# Patient Record
Sex: Female | Born: 1975
Health system: Southern US, Community
[De-identification: ages and names within clinical notes are randomized; demographics above are authoritative.]

## PROBLEM LIST (undated history)

## (undated) DIAGNOSIS — D6861 Antiphospholipid syndrome: Secondary | ICD-10-CM

## (undated) HISTORY — DX: Antiphospholipid syndrome: D68.61

## (undated) HISTORY — PX: KNEE ARTHROSCOPY: SUR90

## (undated) HISTORY — PX: TONSILLECTOMY AND ADENOIDECTOMY: SUR1326

## (undated) HISTORY — PX: LAPAROSCOPIC ABDOMINAL EXPLORATION: SHX6249

## (undated) HISTORY — PX: APPENDECTOMY: SHX54

---

## 1994-09-16 HISTORY — PX: HEMORRHOID SURGERY: SHX153

## 2006-09-16 HISTORY — PX: WRIST SURGERY: SHX841

## 2010-03-22 ENCOUNTER — Emergency Department (HOSPITAL_BASED_OUTPATIENT_CLINIC_OR_DEPARTMENT_OTHER): Admission: EM | Admit: 2010-03-22 | Discharge: 2010-03-22 | Payer: Self-pay | Admitting: Emergency Medicine

## 2010-04-18 ENCOUNTER — Emergency Department (HOSPITAL_BASED_OUTPATIENT_CLINIC_OR_DEPARTMENT_OTHER): Admission: EM | Admit: 2010-04-18 | Discharge: 2010-04-19 | Payer: Self-pay | Admitting: Emergency Medicine

## 2010-06-25 ENCOUNTER — Emergency Department (HOSPITAL_BASED_OUTPATIENT_CLINIC_OR_DEPARTMENT_OTHER): Admission: EM | Admit: 2010-06-25 | Discharge: 2010-06-25 | Payer: Self-pay | Admitting: Emergency Medicine

## 2010-11-29 LAB — BASIC METABOLIC PANEL
BUN: 19 mg/dL (ref 6–23)
CO2: 28 mEq/L (ref 19–32)
Calcium: 9.1 mg/dL (ref 8.4–10.5)
Creatinine, Ser: 0.8 mg/dL (ref 0.4–1.2)
Glucose, Bld: 96 mg/dL (ref 70–99)

## 2010-11-29 LAB — URINALYSIS, ROUTINE W REFLEX MICROSCOPIC
Bilirubin Urine: NEGATIVE
Hgb urine dipstick: NEGATIVE
Protein, ur: NEGATIVE mg/dL
Urobilinogen, UA: 1 mg/dL (ref 0.0–1.0)

## 2010-11-29 LAB — PREGNANCY, URINE: Preg Test, Ur: NEGATIVE

## 2010-11-30 LAB — BASIC METABOLIC PANEL
BUN: 18 mg/dL (ref 6–23)
Calcium: 9.3 mg/dL (ref 8.4–10.5)
Creatinine, Ser: 0.9 mg/dL (ref 0.4–1.2)
GFR calc non Af Amer: >60 mL/min (ref >60)
Glucose, Bld: 96 mg/dL (ref 70–99)

## 2010-11-30 LAB — DIFFERENTIAL
Basophils Absolute: 0.1 10*3/uL (ref 0.0–0.1)
Basophils Relative: 1 % (ref 0–1)
Monocytes Absolute: 0.7 10*3/uL (ref 0.1–1.0)
Neutro Abs: 5.9 10*3/uL (ref 1.7–7.7)
Neutrophils Relative %: 58 % (ref 43–77)

## 2010-11-30 LAB — CBC
HCT: 40.5 % (ref 36.0–46.0)
MCHC: 33.6 g/dL (ref 30.0–36.0)
Platelets: 179 10*3/uL (ref 150–400)
RDW: 12.4 % (ref 11.5–15.5)

## 2010-12-25 ENCOUNTER — Other Ambulatory Visit: Payer: Self-pay | Admitting: Obstetrics and Gynecology

## 2010-12-25 ENCOUNTER — Ambulatory Visit (HOSPITAL_COMMUNITY)
Admission: RE | Admit: 2010-12-25 | Discharge: 2010-12-25 | Disposition: A | Discharge status: Home / Self Care | Payer: Medicaid Other | Source: Ambulatory Visit | Attending: Obstetrics and Gynecology | Admitting: Obstetrics and Gynecology

## 2010-12-25 ENCOUNTER — Ambulatory Visit (HOSPITAL_COMMUNITY): Payer: Medicaid Other

## 2010-12-25 DIAGNOSIS — O3680X0 Pregnancy with inconclusive fetal viability, not applicable or unspecified: Secondary | ICD-10-CM

## 2010-12-25 DIAGNOSIS — O021 Missed abortion: Secondary | ICD-10-CM | POA: Insufficient documentation

## 2010-12-25 LAB — ABO/RH: ABO/RH(D): A POS

## 2010-12-25 LAB — CBC
MCH: 30.5 pg (ref 26.0–34.0)
Platelets: 215 10*3/uL (ref 150–400)
RBC: 4.49 MIL/uL (ref 3.87–5.11)

## 2010-12-26 LAB — HM PAP SMEAR: HM Pap smear: NEGATIVE

## 2011-01-02 NOTE — H&P (Signed)
  NAMEGENEVIA, Terri Foster             ACCOUNT NO.:  [id number]  MEDICAL RECORD NO.:  [id number]           PATIENT TYPE:  LOCATION:                                 FACILITY:  PHYSICIAN:  Sherron Monday, MD        DATE OF BIRTH:  11/10/1975  DATE OF ADMISSION:  12/25/2010 DATE OF DISCHARGE:                             HISTORY & PHYSICAL   ADMITTING DIAGNOSES: 1. Missed abortion measuring approximately 8-week size. 2. Positive anticardiolipin antibody.  HISTORY OF PRESENT ILLNESS:  A 35 year old G78, P 1-0-2-1 at 8 and 6 by dates who presents for an OB workup today.  She has had several ultrasounds that verified intrauterine pregnancy and even showed heart sounds, but today on presentation after her workup ultrasound revealed absent heart tones and fetal rump length that measuring approximately 8 weeks.  After discussion of various options including effective management, Cytotec induction versus suction, D and C, the patient decides to pursue with D and C.  After discussion of risks, benefits, and alteratives including but not limited to bleeding, infection, damage to her uterus, this was scheduled tomorrow at the hospital.  PAST MEDICAL HISTORY:  Significant for infertility and positive anticardiolipin antibody.  PAST SURGICAL HISTORY:  Significant for appendectomy, hemorrhoid surgery, knee surgery, laparoscopy and a tonsillectomy and adenoidectomy.  PAST OBSTETRIC AND GYNECOLOGIC HISTORY:  She is a G4, P 1-0-2-1 at approximately 8 and 6 by dates.  She has a history of a term delivery sometimes of a female infant in 2006 as well as 2 miscarriages.  No history of any abnormal Pap smear or sexually transmitted diseases.  MEDICATIONS:  Lovenox daily, aspirin, Caltrate, and prenatal vitamins.  ALLERGIES:  ERYTHROMYCIN which causes rash and vomiting.  No latex allergy.  SOCIAL HISTORY:  Denies alcohol, tobacco, or drug use.  She is married.  FAMILY HISTORY:  Diabetes, hypertension,  heart disease, Hilda Blades disease, lupus, ovarian cancer and unknown type of cancer.  PHYSICAL EXAMINATION:  VITAL SIGNS:  She is afebrile.  Vital signs stable. GENERAL:  No apparent distress. CARDIOVASCULAR:  Regular rate and rhythm. LUNGS:  Clear to auscultation bilaterally. ABDOMEN:  Soft, nontender and nondistended with good bowel sounds. EXTREMITIES:  Symmetric and nontender.  On an ultrasound performed today noted that there is no fetal sounds.  ASSESSMENT AND PLAN:  A 35 year old G4, P 1-0-2-1 at 8 weeks with missed abortion for suction, D and C.  Discussed with the patient risks, benefits, and alternatives and will proceed.     Sherron Monday, MD     JB/MEDQ  D:  12/24/2010  T:  12/24/2010  Job:  [id number]  Electronically Signed by Sherron Monday MD on 01/02/2011 04:32:23 PM

## 2011-01-02 NOTE — Op Note (Signed)
  Terri Foster, Terri Foster             ACCOUNT NO.:  [id number]  MEDICAL RECORD NO.:  [id number]           PATIENT TYPE:  O  LOCATION:  [facility]                          FACILITY:  [facility]  PHYSICIAN:  Sherron Monday, MD        DATE OF BIRTH:  [birth year]  DATE OF PROCEDURE:  12/25/2010 DATE OF DISCHARGE:                              OPERATIVE REPORT   PREOPERATIVE DIAGNOSIS:  Missed abortion approximately 8 weeks.  POSTOPERATIVE DIAGNOSIS:  Missed abortion approximately 8 weeks.  PROCEDURE:  Suction dilatation and evacuation.  SURGEON:  Sherron Monday, MD  ASSISTANT:  None.  ANESTHESIA:  MAC with 16 mL of 2% lidocaine for a paracervical block.  FINDINGS:  Uterus sounds to approximately 10-cm products of conception.  ASSISTANT:  Products of conception to Pathology.  ESTIMATED BLOOD LOSS:  Minimal.  IV FLUIDS:  1000 mL.  URINE OUTPUT:  I and O cath prior to the procedure.  COMPLICATIONS:  None.  DISPOSITION:  Stable to PACU following the procedure.  PROCEDURE:  After informed consent was reviewed with the patient including risks, benefits and alternatives of the surgical procedure, she was transported to the OR, placed on the table in supine position. MAC was induced and found to be adequate.  She was then prepped and draped in the normal sterile fashion, placed in the [place] stirrups. Her bladder was sterilely drained using an open-sided speculum.  Her cervix was easily visualized after approximately 4 mL were infiltrated into the anterior cervical lip.  It was grasped with a single-toothed tenaculum.  A paracervical block was then placed at approximately 5 and 7 o'clock for a total of 16 mL.  The cervix was then dilated to 24- [place] to accommodate an 8 curette.  After her uterus had been sounded, D and C was performed with several passes revealing products of conception.  The suction curette and tenaculum were removed from her vagina and found to be hemostatic. The speculum was  removed.  The patient was awakened in stable condition. Sponge, lap and needle counts were correct x2.     Sherron Monday, MD     JB/MEDQ  D:  12/25/2010  T:  01-06-11  Job:  [id number]  Electronically Signed by Sherron Monday MD on 01/02/2011 04:32:32 PM

## 2011-01-15 DEATH — deceased

## 2016-03-27 ENCOUNTER — Ambulatory Visit (INDEPENDENT_AMBULATORY_CARE_PROVIDER_SITE_OTHER): Payer: BLUE CROSS/BLUE SHIELD | Admitting: Family Medicine

## 2016-03-27 ENCOUNTER — Encounter: Payer: Self-pay | Admitting: Family Medicine

## 2016-03-27 VITALS — BP 111/75 | HR 69 | Wt 170.0 lb

## 2016-03-27 DIAGNOSIS — M6249 Contracture of muscle, multiple sites: Secondary | ICD-10-CM | POA: Diagnosis not present

## 2016-03-27 DIAGNOSIS — M62838 Other muscle spasm: Secondary | ICD-10-CM | POA: Insufficient documentation

## 2016-03-27 MED ORDER — CYCLOBENZAPRINE HCL 10 MG PO TABS: 5.0000 mg | ORAL_TABLET | Freq: Every evening | ORAL | Status: DC | PRN

## 2016-03-27 NOTE — Patient Instructions (Signed)
Thank you for coming in today. Come back or go to the emergency room if you notice new weakness new numbness problems walking or bowel or bladder problems.  Take cyclobenzaprine at bedtime as needed for muscle spasms. Use a heating pad and a TENS unit. Attend physical therapy  Return in 2-4 weeks if not better.  TENS UNIT: This is helpful for muscle pain and spasm.   Search and Purchase a TENS 7000 2nd edition at www.tenspros.com. It should be less than $30.     TENS unit instructions: Do not shower or bathe with the unit on Turn the unit off before removing electrodes or batteries If the electrodes lose stickiness add a drop of water to the electrodes after they are disconnected from the unit and place on plastic sheet. If you continued to have difficulty, call the TENS unit company to purchase more electrodes. Do not apply lotion on the skin area prior to use. Make sure the skin is clean and dry as this will help prolong the life of the electrodes. After use, always check skin for unusual red areas, rash or other skin difficulties. If there are any skin problems, does not apply electrodes to the same area. Never remove the electrodes from the unit by pulling the wires. Do not use the TENS unit or electrodes other than as directed. Do not change electrode placement without consultating your therapist or physician. Keep 2 fingers with between each electrode. Wear time ratio is 2:1, on to off times.    For example on for 30 minutes off for 15 minutes and then on for 30 minutes off for 15 minutes   Cervical Sprain A cervical sprain is an injury in the neck in which the strong, fibrous tissues (ligaments) that connect your neck bones stretch or tear. Cervical sprains can range from mild to severe. Severe cervical sprains can cause the neck vertebrae to be unstable. This can lead to damage of the spinal cord and can result in serious nervous system problems. The amount of time it takes  for a cervical sprain to get better depends on the cause and extent of the injury. Most cervical sprains heal in 1 to 3 weeks. CAUSES  Severe cervical sprains may be caused by:   Contact sport injuries (such as from football, rugby, wrestling, hockey, auto racing, gymnastics, diving, martial arts, or boxing).   Motor vehicle collisions.   Whiplash injuries. This is an injury from a sudden forward and backward whipping movement of the head and neck.  Falls.  Mild cervical sprains may be caused by:   Being in an awkward position, such as while cradling a telephone between your ear and shoulder.   Sitting in a chair that does not offer proper support.   Working at a poorly Marketing executive station.   Looking up or down for long periods of time.  SYMPTOMS   Pain, soreness, stiffness, or a burning sensation in the front, back, or sides of the neck. This discomfort may develop immediately after the injury or slowly, 24 hours or more after the injury.   Pain or tenderness directly in the middle of the back of the neck.   Shoulder or upper back pain.   Limited ability to move the neck.   Headache.   Dizziness.   Weakness, numbness, or tingling in the hands or arms.   Muscle spasms.   Difficulty swallowing or chewing.   Tenderness and swelling of the neck.  DIAGNOSIS  Most of the  time your health care provider can diagnose a cervical sprain by taking your history and doing a physical exam. Your health care provider will ask about previous neck injuries and any known neck problems, such as arthritis in the neck. X-rays may be taken to find out if there are any other problems, such as with the bones of the neck. Other tests, such as a CT scan or MRI, may also be needed.  TREATMENT  Treatment depends on the severity of the cervical sprain. Mild sprains can be treated with rest, keeping the neck in place (immobilization), and pain medicines. Severe cervical sprains  are immediately immobilized. Further treatment is done to help with pain, muscle spasms, and other symptoms and may include:  Medicines, such as pain relievers, numbing medicines, or muscle relaxants.   Physical therapy. This may involve stretching exercises, strengthening exercises, and posture training. Exercises and improved posture can help stabilize the neck, strengthen muscles, and help stop symptoms from returning.  HOME CARE INSTRUCTIONS   Put ice on the injured area.   Put ice in a plastic bag.   Place a towel between your skin and the bag.   Leave the ice on for 15-20 minutes, 3-4 times a day.   If your injury was severe, you may have been given a cervical collar to wear. A cervical collar is a two-piece collar designed to keep your neck from moving while it heals.  Do not remove the collar unless instructed by your health care provider.  If you have long hair, keep it outside of the collar.  Ask your health care provider before making any adjustments to your collar. Minor adjustments may be required over time to improve comfort and reduce pressure on your chin or on the back of your head.  Ifyou are allowed to remove the collar for cleaning or bathing, follow your health care provider's instructions on how to do so safely.  Keep your collar clean by wiping it with mild soap and water and drying it completely. If the collar you have been given includes removable pads, remove them every 1-2 days and hand wash them with soap and water. Allow them to air dry. They should be completely dry before you wear them in the collar.  If you are allowed to remove the collar for cleaning and bathing, wash and dry the skin of your neck. Check your skin for irritation or sores. If you see any, tell your health care provider.  Do not drive while wearing the collar.   Only take over-the-counter or prescription medicines for pain, discomfort, or fever as directed by your health care  provider.   Keep all follow-up appointments as directed by your health care provider.   Keep all physical therapy appointments as directed by your health care provider.   Make any needed adjustments to your workstation to promote good posture.   Avoid positions and activities that make your symptoms worse.   Warm up and stretch before being active to help prevent problems.  SEEK MEDICAL CARE IF:   Your pain is not controlled with medicine.   You are unable to decrease your pain medicine over time as planned.   Your activity level is not improving as expected.  SEEK IMMEDIATE MEDICAL CARE IF:   You develop any bleeding.  You develop stomach upset.  You have signs of an allergic reaction to your medicine.   Your symptoms get worse.   You develop new, unexplained symptoms.   You have  numbness, tingling, weakness, or paralysis in any part of your body.  MAKE SURE YOU:   Understand these instructions.  Will watch your condition.  Will get help right away if you are not doing well or get worse.   This information is not intended to replace advice given to you by your health care provider. Make sure you discuss any questions you have with your health care provider.   Document Released: 06/30/2007 Document Revised: 09/07/2013 Document Reviewed: 03/10/2013 Elsevier Interactive Patient Education [employer]

## 2016-03-27 NOTE — Progress Notes (Signed)
   Terri Foster is a 40 y.o. female who [facility] today for neck pain. Patient has a ten-day history of acute onset right lateral neck pain. Patient usually sleeps with a special cervical pillow. However she was on vacation about 10 days ago and forgot her special pillow. She awoke one morning with a "kink" in her neck. This was mild and worsened dramatically when she exercises by running on a treadmill. She felt sudden onset of cramping and spasm in her right lateral neck area and since then the pain has been moderate to severe and limiting her ability to move her neck normally and to exercise normally. She denies any radiating pain weakness or numbness fevers or chills. She's tried some methocarbamol NSAIDs heat and ice which have all not help very much at all.   No past medical history on file. No past surgical history on file. Social History  Substance Use Topics  . Smoking status: Never Smoker   . Smokeless tobacco: Not on file  . Alcohol Use: No   family history is not on file.  ROS:  No headache, visual changes, nausea, vomiting, diarrhea, constipation, dizziness, abdominal pain, skin rash, fevers, chills, night sweats, weight loss, swollen lymph nodes, body aches, joint swelling, muscle aches, chest pain, shortness of breath, mood changes, visual or auditory hallucinations.    Medications: Current Outpatient Prescriptions  Medication Sig Dispense Refill  . Multiple Vitamin (MULTIVITAMIN) capsule Take 1 capsule by mouth daily.    . cyclobenzaprine (FLEXERIL) 10 MG tablet Take 0.5-1 tablets (5-10 mg total) by mouth at bedtime as needed for muscle spasms. 30 tablet 1   No current facility-administered medications for this visit.   Allergies  Allergen Reactions  . Azithromycin Rash     Exam:  BP 111/75 mmHg  Pulse 69  Wt 170 lb (77.111 kg) General: Well Developed, well nourished, and in no acute distress.    Neuro/Psych: Alert and oriented x3, extra-ocular muscles intact, able to move all 4 extremities, sensation grossly intact. Skin: Warm and dry, no rashes noted.  Respiratory: Not using accessory muscles, speaking in full sentences, trachea midline.  Cardiovascular: Pulses palpable, no extremity edema. Abdomen: Does not appear distended. MSK: Neck: Nontender to spinal midline. Tender palpation superior right cervical paraspinal muscle group. Nontender trapezius. Significantly limited neck motion by pain Upper extremity strength is equal and normal throughout as are reflexes and sensation. No tremor noted.   No results found for this or any previous visit (from the past 24 hour(s)). No results found.   Assessment and Plan: 40 y.o. female with cervical paraspinal muscle spasm and strain. Plan for cyclobenzaprine heating pad TENS unit NSAIDs and referral to physical therapy. Return in 2-4 weeks if not better or sooner if worsening.

## 2016-04-02 ENCOUNTER — Telehealth: Payer: Self-pay | Admitting: Family Medicine

## 2016-04-02 ENCOUNTER — Ambulatory Visit: Payer: BLUE CROSS/BLUE SHIELD | Admitting: Rehabilitative and Restorative Service Providers"

## 2016-04-02 MED ORDER — TRAMADOL HCL 50 MG PO TABS: 50.0000 mg | ORAL_TABLET | Freq: Three times a day (TID) | ORAL | Status: DC | PRN

## 2016-04-02 NOTE — Telephone Encounter (Signed)
Will use tramadol.

## 2016-04-02 NOTE — Telephone Encounter (Signed)
Pt advised of Rx, it has been faxed to preferred pharmacy.

## 2016-04-02 NOTE — Telephone Encounter (Signed)
Pt called clinic stating she cannot get an appt until next Thursday with PT and is having a lot of neck pain. Pt states the recommendations from last OV are not helping. Questions if something else can be tried. Will route.

## 2016-04-09 ENCOUNTER — Ambulatory Visit: Payer: BLUE CROSS/BLUE SHIELD | Admitting: Rehabilitative and Restorative Service Providers"

## 2016-04-10 ENCOUNTER — Ambulatory Visit: Payer: BLUE CROSS/BLUE SHIELD | Admitting: Rehabilitative and Restorative Service Providers"

## 2016-04-26 ENCOUNTER — Ambulatory Visit (INDEPENDENT_AMBULATORY_CARE_PROVIDER_SITE_OTHER): Payer: BLUE CROSS/BLUE SHIELD | Admitting: Family Medicine

## 2016-04-26 ENCOUNTER — Encounter: Payer: Self-pay | Admitting: Family Medicine

## 2016-04-26 VITALS — BP 112/55 | HR 56 | Resp 15 | Ht 66.0 in | Wt 170.0 lb

## 2016-04-26 DIAGNOSIS — D6861 Antiphospholipid syndrome: Secondary | ICD-10-CM | POA: Insufficient documentation

## 2016-04-26 DIAGNOSIS — S161XXA Strain of muscle, fascia and tendon at neck level, initial encounter: Secondary | ICD-10-CM

## 2016-04-26 DIAGNOSIS — R635 Abnormal weight gain: Secondary | ICD-10-CM

## 2016-04-26 DIAGNOSIS — Z13220 Encounter for screening for lipoid disorders: Secondary | ICD-10-CM

## 2016-04-26 DIAGNOSIS — M2170 Unequal limb length (acquired), unspecified site: Secondary | ICD-10-CM | POA: Insufficient documentation

## 2016-04-26 HISTORY — DX: Antiphospholipid syndrome: D68.61

## 2016-04-26 NOTE — Progress Notes (Signed)
Subjective:    Patient ID: Terri Foster, female    DOB: 06-Mar-1976, 40 y.o.   MRN: [id number]  HPI  This is a 40 year old female with no significant past medical history comes in today to discuss establish care. She her husband recently moved to the area. Her mother lives in the area and is my patient as well. Her mother is Shauna Hugh. She is interested in reestablishing care with an OB/GYN as well.  She has been struggling with some cervical and upper back pain. She has been working with a [employer] and has been getting better. She normally runs and goes to the gym for exercise but has backed off and has just been walking more recently. She also found out she had a leg length discrepancy and has been wearing a heel lift which she feels like has been helpful as well. Next pressure reports she had a tetanus done in 2012.  She thinks she has never had cholesterol screening.  Is also struggling with her weight. She said she gained a lot of weight during her pregnancy was able to get it off but slowly gained some of it back. She's gotten down to her current weight but would really like to lose about 10 more pounds. She is really just plateaued and can't quite figure out how to get the scale to move and wanted some recommendations.   Review of Systems  Constitutional: Negative for diaphoresis, fever and unexpected weight change.  HENT: Negative for hearing loss, rhinorrhea and tinnitus.   Eyes: Negative for visual disturbance.  Respiratory: Negative for cough and wheezing.   Cardiovascular: Negative for chest pain and palpitations.  Gastrointestinal: Negative for blood in stool, diarrhea, nausea and vomiting.  Genitourinary: Negative for difficulty urinating, vaginal bleeding and vaginal discharge.  Musculoskeletal: Negative for arthralgias and myalgias.  Skin: Negative for rash.  Neurological: Negative for headaches.  Hematological: Negative for adenopathy. Does not bruise/bleed easily.   Psychiatric/Behavioral: Negative for dysphoric mood and sleep disturbance. The patient is not nervous/anxious.    BP (!) 112/55 (BP Location: Left Arm, Patient Position: Sitting, Cuff Size: Normal)   Pulse (!) 56   Resp 15   Ht  (1.676 m)   Wt 170 lb (77.1 kg)   LMP 04/26/2016 (Exact Date)   SpO2 100%   BMI 27.44 kg/m     Allergies  Allergen Reactions  . Erythromycin   . Azithromycin Rash    History reviewed. No pertinent past medical history.  Past Surgical History:  Procedure Laterality Date  . APPENDECTOMY    . HEMORRHOID SURGERY  1996  . KNEE ARTHROSCOPY Right   . LAPAROSCOPIC ABDOMINAL EXPLORATION     Fertility  . TONSILLECTOMY AND ADENOIDECTOMY  age 89  . WRIST SURGERY Right 2008    Social History   Social History  . Marital status: Married    Spouse name: Marcello Moores  . Number of children: 1  . Years of education: N/A   Occupational History  . Not on file.   Social History Main Topics  . Smoking status: Never Smoker  . Smokeless tobacco: Never Used  . Alcohol use No  . Drug use: No  . Sexual activity: Yes    Partners: Male    Birth control/ protection: None   Other Topics Concern  . Not on file   Social History Narrative   Married to [place] with one child. She occasionally drinks coffee. Walks 30-40 minutes daily.    Family History  Problem Relation Age of Onset  . Hypertension Father     Outpatient Encounter Prescriptions as of 04/26/2016  Medication Sig  . cyclobenzaprine (FLEXERIL) 10 MG tablet Take 0.5-1 tablets (5-10 mg total) by mouth at bedtime as needed for muscle spasms.  . Multiple Vitamin (MULTIVITAMIN) capsule Take 1 capsule by mouth daily.  . traMADol (ULTRAM) 50 MG tablet Take 1 tablet (50 mg total) by mouth every 8 (eight) hours as needed.   No facility-administered encounter medications on file as of 04/26/2016.          Objective:   Physical Exam  Constitutional: She is oriented to person, place, and time. She appears  well-developed and well-nourished.  HENT:  Head: Normocephalic and atraumatic.  Cardiovascular: Normal rate, regular rhythm and normal heart sounds.   Pulmonary/Chest: Effort normal and breath sounds normal.  Neurological: She is alert and oriented to person, place, and time.  Skin: Skin is warm and dry.  Psychiatric: She has a normal mood and affect. Her behavior is normal.        Assessment & Plan:    She reports her tetanus is up-to-date. She is due for screening lipid panel. She's not sure that she is actually ever had one. Will check a CMP as well.  Abnormal weight gain-she's really had a lot of difficulty with the last 10 pounds that she's been trying to lose. Recommend a smart phone application call my fitness pal to help set goals. Also encourage regular exercise.    Cervical Strain - seeing chiropractor.  Call if sxs worsen or if not improving.

## 2016-04-26 NOTE — Progress Notes (Signed)
Subjective:    CC: New Patient, estab care  HPI:  Past medical history, Surgical history, Family history not pertinant except as noted below, Social history, Allergies, and medications have been entered into the medical record, reviewed, and corrections made.   Review of Systems: No fevers, chills, night sweats, weight loss, chest pain, or shortness of breath.   Objective:    General: Well Developed, well nourished, and in no acute distress.  Neuro: Alert and oriented x3, extra-ocular muscles intact, sensation grossly intact.  HEENT: Normocephalic, atraumatic  Skin: Warm and dry, no rashes. Cardiac: Regular rate and rhythm, no murmurs rubs or gallops, no lower extremity edema.  Respiratory: Clear to auscultation bilaterally. Not using accessory muscles, speaking in full sentences.   Impression and Recommendations:

## 2016-04-26 NOTE — Patient Instructions (Signed)
My [employer]itness Pal is a great free smart phone app to calculate your calories.

## 2016-07-06 LAB — HM MAMMOGRAPHY

## 2016-07-11 ENCOUNTER — Encounter: Payer: Self-pay | Admitting: Family Medicine

## 2016-08-28 ENCOUNTER — Encounter: Payer: Self-pay | Admitting: Physician Assistant

## 2016-08-28 ENCOUNTER — Ambulatory Visit (INDEPENDENT_AMBULATORY_CARE_PROVIDER_SITE_OTHER): Payer: BLUE CROSS/BLUE SHIELD | Admitting: Physician Assistant

## 2016-08-28 VITALS — BP 123/76 | HR 77 | Ht 66.0 in | Wt 164.0 lb

## 2016-08-28 DIAGNOSIS — R110 Nausea: Secondary | ICD-10-CM

## 2016-08-28 DIAGNOSIS — R319 Hematuria, unspecified: Secondary | ICD-10-CM

## 2016-08-28 DIAGNOSIS — R109 Unspecified abdominal pain: Secondary | ICD-10-CM | POA: Diagnosis not present

## 2016-08-28 LAB — POCT URINE PREGNANCY: PREG TEST UR: NEGATIVE

## 2016-08-28 LAB — POCT URINALYSIS DIPSTICK
Glucose, UA: NEGATIVE
NITRITE UA: NEGATIVE
PH UA: 5.5
Protein, UA: NEGATIVE
Spec Grav, UA: >=1.03
UROBILINOGEN UA: 0.2

## 2016-08-28 NOTE — Progress Notes (Signed)
   Subjective:    Patient ID: Terri Foster, female    DOB: 03/19/1976, 40 y.o.   MRN: [id number]  HPI  Pt is a 40 yo female who presents to the clinic with 3 weeks of GI complaints. She went to [place] and has had symptoms since. Started with nausea off and on. She then started having upper and lower abdomen cramping. Last week she had a few headaches which were abnormal. Eating is making her feel nauseated. No stool changes. No ST, fever, chills, body aches. No new diet changes. LMP before thanksgiving but period did seem a little shorter. She has never been able to get pregnant.    Review of Systems  All other systems reviewed and are negative.      Objective:   Physical Exam  Constitutional: She is oriented to person, place, and time. She appears well-developed and well-nourished.  HENT:  Head: Normocephalic and atraumatic.  Cardiovascular: Normal rate, regular rhythm and normal heart sounds.   Pulmonary/Chest: Effort normal and breath sounds normal.  Abdominal: Soft. Bowel sounds are normal. She exhibits no distension and no mass. There is tenderness. There is no rebound and no guarding.  Mild tenderness generally over epigastric area.  Negative murphys sign.  No guarding or rebound.   Neurological: She is alert and oriented to person, place, and time.  Psychiatric: She has a normal mood and affect. Her behavior is normal.          Assessment & Plan:  Marland Kitchen.Marland Kitchen.Terri Foster was seen today for abdominal pain, nausea and headache.  Diagnoses and all orders for this visit:  Nausea without vomiting -     POCT urine pregnancy -     US Abdomen Complete; Future -     COMPLETE METABOLIC PANEL WITH GFR -     CBC with Differential/Platelet -     Lipase -     POCT urinalysis dipstick -     Urine Culture -     omeprazole (PRILOSEC) 40 MG capsule; Take 1 capsule (40 mg total) by mouth daily.  Abdominal pain, unspecified abdominal location -     POCT urine pregnancy -      US Abdomen Complete; Future -     COMPLETE METABOLIC PANEL WITH GFR -     CBC with Differential/Platelet -     Lipase -     POCT urinalysis dipstick -     Urine Culture -     omeprazole (PRILOSEC) 40 MG capsule; Take 1 capsule (40 mg total) by mouth daily.  Hematuria, unspecified type -     Urine Culture   UPT was negative.  Will culture UA due to blood and leuks. Since asymptomatic will not treat with abx until culture comes back.  Ultrasound of abdomen ordered.  Declined anti-nausea meds.  If everything normal try PPI for 10 days and see if symptoms improve.  Consider probiotic.

## 2016-08-29 LAB — COMPLETE METABOLIC PANEL WITH GFR
ALBUMIN: 4.1 g/dL (ref 3.6–5.1)
ALK PHOS: 70 U/L (ref 33–115)
ALT: 15 U/L (ref 6–29)
AST: 18 U/L (ref 10–30)
BUN: 15 mg/dL (ref 7–25)
CO2: 25 mmol/L (ref 20–31)
Calcium: 9.3 mg/dL (ref 8.6–10.2)
Chloride: 103 mmol/L (ref 98–110)
Creat: 0.92 mg/dL (ref 0.50–1.10)
GFR, EST NON AFRICAN AMERICAN: 78 mL/min (ref >=60)
GFR, Est African American: 89 mL/min (ref >=60)
GLUCOSE: 88 mg/dL (ref 65–99)
POTASSIUM: 4.3 mmol/L (ref 3.5–5.3)
SODIUM: 139 mmol/L (ref 135–146)
Total Bilirubin: 0.7 mg/dL (ref 0.2–1.2)
Total Protein: 6.7 g/dL (ref 6.1–8.1)

## 2016-08-29 LAB — CBC WITH DIFFERENTIAL/PLATELET
BASOS ABS: 0 {cells}/uL (ref 0–200)
BASOS PCT: 0 %
EOS PCT: 1 %
Eosinophils Absolute: 83 cells/uL (ref 15–500)
HCT: 45.3 % — ABNORMAL HIGH (ref 35.0–45.0)
HEMOGLOBIN: 14.9 g/dL (ref 11.7–15.5)
LYMPHS ABS: 3154 {cells}/uL (ref 850–3900)
Lymphocytes Relative: 38 %
MCH: 29.4 pg (ref 27.0–33.0)
MCHC: 32.9 g/dL (ref 32.0–36.0)
MCV: 89.5 fL (ref 80.0–100.0)
MPV: 11.8 fL (ref 7.5–12.5)
Monocytes Absolute: 664 cells/uL (ref 200–950)
Monocytes Relative: 8 %
NEUTROS ABS: 4399 {cells}/uL (ref 1500–7800)
Neutrophils Relative %: 53 %
Platelets: 208 10*3/uL (ref 140–400)
RBC: 5.06 MIL/uL (ref 3.80–5.10)
RDW: 13.7 % (ref 11.0–15.0)
WBC: 8.3 10*3/uL (ref 3.8–10.8)

## 2016-08-29 LAB — LIPASE: LIPASE: 23 U/L (ref 7–60)

## 2016-08-29 NOTE — Progress Notes (Signed)
I am waiting for the culture to return. That will determine treatment since she is asymptomatic.

## 2016-08-30 DIAGNOSIS — R109 Unspecified abdominal pain: Secondary | ICD-10-CM | POA: Insufficient documentation

## 2016-08-30 DIAGNOSIS — R319 Hematuria, unspecified: Secondary | ICD-10-CM | POA: Insufficient documentation

## 2016-08-30 LAB — URINE CULTURE: Organism ID, Bacteria: NO GROWTH

## 2016-08-30 MED ORDER — OMEPRAZOLE 40 MG PO CPDR: 40.0000 mg | DELAYED_RELEASE_CAPSULE | Freq: Every day | ORAL | 2 refills | Status: DC

## 2016-09-04 ENCOUNTER — Ambulatory Visit (INDEPENDENT_AMBULATORY_CARE_PROVIDER_SITE_OTHER): Payer: BLUE CROSS/BLUE SHIELD

## 2016-09-04 DIAGNOSIS — R110 Nausea: Secondary | ICD-10-CM

## 2016-09-04 DIAGNOSIS — R109 Unspecified abdominal pain: Secondary | ICD-10-CM

## 2016-09-04 DIAGNOSIS — R1011 Right upper quadrant pain: Secondary | ICD-10-CM | POA: Diagnosis not present

## 2016-09-04 NOTE — Progress Notes (Signed)
Call pt: gallbladder showed no stones or wall thickening. If continues to have pain then consider HIDA scan to evaluate functionally  of gallbladder is symptoms persist.

## 2016-09-05 NOTE — Progress Notes (Signed)
Ok to send zolfran 8mg  1 tablet every 8 hours as needed for nausea. #30 1 refill

## 2016-09-06 ENCOUNTER — Other Ambulatory Visit: Payer: Self-pay

## 2016-09-06 MED ORDER — ONDANSETRON 8 MG PO TBDP: 8.0000 mg | ORAL_TABLET | Freq: Three times a day (TID) | ORAL | 1 refills | Status: DC | PRN

## 2016-10-29 ENCOUNTER — Encounter: Payer: Self-pay | Admitting: Physician Assistant

## 2016-10-29 ENCOUNTER — Ambulatory Visit (INDEPENDENT_AMBULATORY_CARE_PROVIDER_SITE_OTHER): Payer: BLUE CROSS/BLUE SHIELD | Admitting: Physician Assistant

## 2016-10-29 VITALS — BP 113/72 | HR 73 | Ht 66.0 in | Wt 171.0 lb

## 2016-10-29 DIAGNOSIS — K921 Melena: Secondary | ICD-10-CM | POA: Diagnosis not present

## 2016-10-29 DIAGNOSIS — R1013 Epigastric pain: Secondary | ICD-10-CM | POA: Diagnosis not present

## 2016-10-29 LAB — POCT HEMOGLOBIN: Hemoglobin: 12.4 g/dL (ref 12.2–16.2)

## 2016-10-29 NOTE — Progress Notes (Addendum)
Subjective:     Patient ID: Terri Foster, female   DOB: 06/20/1976, 41 y.o.   MRN: [id number]  HPI  This is a 41 year old female who presents for persistent abdominal pain since November 2017. States it is located in the epigastric region and occasionally radiates to the RUQ. Pain is constant and is further aggravated by eating and running. Also complains of associated nausea. States she has experienced increase flatulence and belching. Relays she has not been taking Prilosec because it did not help after 10 days. Abdominal U/S was unremarkable. CBC, CMP, lipase all normal as well in December 2017. States stools are darker than normal and have a "sticky" appearance for the past month.  Denies NSAID and alcohol use.  Active Ambulatory Problems    Diagnosis Date Noted  . Cervical paraspinal muscle spasm 03/27/2016  . Antiphospholipid antibody syndrome (HCC) 04/26/2016  . Leg length discrepancy 04/26/2016  . Abdominal pain 08/30/2016  . Hematuria 08/30/2016   Resolved Ambulatory Problems    Diagnosis Date Noted  . No Resolved Ambulatory Problems   No Additional Past Medical History    Review of Systems  Constitutional: Negative for appetite change, fatigue, fever and unexpected weight change.  HENT: Negative.   Respiratory: Negative.   Cardiovascular: Negative.   Gastrointestinal: Positive for abdominal pain and nausea. Negative for anal bleeding, constipation, diarrhea and vomiting.       Positive for melena.  Genitourinary: Negative for dysuria, flank pain, hematuria and pelvic pain.  Musculoskeletal: Negative for arthralgias and myalgias.       Objective:   Physical Exam  Constitutional: She is oriented to person, place, and time. She appears well-developed and well-nourished.  HENT:  Head: Normocephalic and atraumatic.  Cardiovascular: Normal rate and regular rhythm.  Exam reveals no gallop and no friction rub.   No murmur heard. Pulmonary/Chest: Effort normal and breath  sounds normal. She has no wheezes. She has no rales. She exhibits no tenderness.  Abdominal: Soft. Bowel sounds are normal. She exhibits no distension and no mass. There is tenderness in the epigastric area and left upper quadrant. There is no rebound, no guarding, no CVA tenderness, no tenderness at McBurney's point and negative Murphy's sign.    Musculoskeletal: Normal range of motion.  Neurological: She is alert and oriented to person, place, and time.  Skin: Skin is warm and dry. No rash noted.  Psychiatric: She has a normal mood and affect. Her behavior is normal.   POCT hemoglobin 12.4 today    Assessment/Plan:  [name] was seen today for abdominal pain.  Diagnoses and all orders for this visit:  Epigastric pain -     H. pylori breath test -     Ambulatory referral to Gastroenterology  Melena -     POCT hemoglobin -     H. pylori breath test   Hemoglobin 14.9 on 08/28/16 and 12.4 today which is suspicious for GI bleed. Ambulatory referral to GI given chronicity of pain as well as possible need for endoscopy pending urea breath test results. Restart PPI. Concern for hiatal hernia vs esophageal stricture vs PUD vs chronic cholecystitis.

## 2016-10-30 LAB — H. PYLORI BREATH TEST: H. pylori Breath Test: NOT DETECTED

## 2016-10-30 NOTE — Progress Notes (Signed)
Call pt: h.pylori is negative. Follow up with GI. Please let us know if you don't have appt.

## 2016-11-05 ENCOUNTER — Encounter: Payer: Self-pay | Admitting: Physician Assistant

## 2016-11-05 ENCOUNTER — Encounter: Payer: Self-pay | Admitting: Gastroenterology

## 2016-11-05 ENCOUNTER — Ambulatory Visit (INDEPENDENT_AMBULATORY_CARE_PROVIDER_SITE_OTHER): Payer: BLUE CROSS/BLUE SHIELD | Admitting: Physician Assistant

## 2016-11-05 VITALS — BP 98/66 | HR 78 | Ht 65.5 in | Wt 171.0 lb

## 2016-11-05 DIAGNOSIS — R110 Nausea: Secondary | ICD-10-CM | POA: Diagnosis not present

## 2016-11-05 DIAGNOSIS — R194 Change in bowel habit: Secondary | ICD-10-CM

## 2016-11-05 DIAGNOSIS — R1013 Epigastric pain: Secondary | ICD-10-CM | POA: Diagnosis not present

## 2016-11-05 DIAGNOSIS — R1314 Dysphagia, pharyngoesophageal phase: Secondary | ICD-10-CM | POA: Diagnosis not present

## 2016-11-05 NOTE — Progress Notes (Signed)
Chief Complaint: Epigastric Pain  HPI:  [name] is a 41 year old Caucasian female with no significant past medical history,  who was referred to me by Jomarie LongsBreeback, Jade L, PA-C for a complaint of epigastric pain.      According to chart review patient has been followed by her PCP for this pain since back in December. When she initially presented this pain had started in November and was epigastric and spread some to her right upper quadrant. This was constant and further aggravated by eating and running. Patient also described accompanying nausea as well as an increase in flatulence and belching. She had tried Prilosec 40 mg for 10 days but this did not help so she stopped this medication. Abdominal ultrasound completed 09/04/16 showed no gallstones or sonographic evidence of acute cholecystitis as well as a normal appearance of the liver and spleen and limited visualization of the pancreas. Patient has also had labs to include a CBC, CMP and lipase which were all normal. Patient has also had an H. pylori breath test which was negative. Patient had described her stools being "darker than normal" over the past month and "sticky". Of note patient's labs did show a hemoglobin which has dropped from 14.9 2 months ago to 12.4 7 days ago.   Today, the patient tells me that this epigastric pain, came on out of the blue in November. It actually started with severe nausea which made her unable to eat and then she developed an epigastric pain which somewhat radiated over to her right side. Since that time and this is been a constant 6/10 and sometimes increases. The patient has been unable to pinpoint exactly what makes this worse but sometimes it is worse when she is running and sometimes worse when she eats. Patient tells me that if she "touches this area it hurts there". She also describes an increase in gas and belching over this time. Patient also describes some symptoms of dysphagia noting that chicken  feels like it got stuck in her throat the other day and caused a severe "shooting pain", this passed when she felt like it "passed into my stomach". Patient has also felt "colder than normal". She did try Omeprazole 40 mg daily for a little over 10 days but did not feel like this made a difference so she stopped this medication.   Patient also tells me that her bowel movements have changed and are more "sticky looking", they also have a "stronger smell", and looked darker than normal. She also does not have a bowel movement every day.    Patient denies fever, chills, bright red blood in her stool, change in diet previous to this, weight loss, fatigue, anorexia, vomiting, heartburn, reflux, symptoms that awaken her at night, changes in medication recent NSAIDs.    Past Medical History: None  Past Surgical History:  Procedure Laterality Date  . APPENDECTOMY    . HEMORRHOID SURGERY  1996  . KNEE ARTHROSCOPY Right   . LAPAROSCOPIC ABDOMINAL EXPLORATION     Fertility  . TONSILLECTOMY AND ADENOIDECTOMY  age 89  . WRIST SURGERY Right 2008    Current Outpatient Prescriptions  Medication Sig Dispense Refill  . ondansetron (ZOFRAN ODT) 8 MG disintegrating tablet Take 1 tablet (8 mg total) by mouth every 8 (eight) hours as needed for nausea or vomiting. 30 tablet 1  . omeprazole (PRILOSEC) 40 MG capsule Take 1 capsule (40 mg total) by mouth daily. (Patient not taking: Reported on 11/05/2016) 30 capsule 2  No current facility-administered medications for this visit.     Allergies as of 11/05/2016 - Review Complete 11/05/2016  Allergen Reaction Noted  . Erythromycin Hives 04/26/2016  . Azithromycin Rash 03/27/2016    Family History  Problem Relation Age of Onset  . Hypertension Father     Social History   Social History  . Marital status: Married    Spouse name: Marcello Moores  . Number of children: 1  . Years of education: N/A   Occupational History  . Not on file.   Social History Main  Topics  . Smoking status: Never Smoker  . Smokeless tobacco: Never Used  . Alcohol use No  . Drug use: No  . Sexual activity: Yes    Partners: Male    Birth control/ protection: None   Other Topics Concern  . Not on file   Social History Narrative   Married to [place] with one child. She occasionally drinks coffee. Walks 30-40 minutes daily.    Review of Systems:    Constitutional: No weight loss, fever or chills Skin: No rash Cardiovascular: No chest pain  Respiratory: No SOB Gastrointestinal: See HPI and otherwise negative Genitourinary: No dysuria or change in urinary frequency Neurological: No headache, dizziness or syncope Musculoskeletal: No new muscle or joint pain Hematologic: No bruising Psychiatric: No history of depression or anxiety   Physical Exam:  Vital signs: Ht 5' 5.5" (1.664 m)   Wt 171 lb (77.6 kg)   LMP 10/25/2016 (Exact Date)   BMI 28.02 kg/m   Constitutional:   Pleasant Caucasian female appears to be in NAD, Well developed, Well nourished, alert and cooperative Head:  Normocephalic and atraumatic. Eyes:   PEERL, EOMI. No icterus. Conjunctiva pink. Ears:  Normal auditory acuity. Neck:  Supple Throat: Oral cavity and pharynx without inflammation, swelling or lesion.  Respiratory: Respirations even and unlabored. Lungs clear to auscultation bilaterally.   No wheezes, crackles, or rhonchi.  Cardiovascular: Normal S1, S2. No MRG. Regular rate and rhythm. No peripheral edema, cyanosis or pallor.  Gastrointestinal:  Soft, nondistended,moderate epigastric tenderness No rebound or guarding. Normal bowel sounds. No appreciable masses or hepatomegaly. Rectal:  Not performed.  Msk:  Symmetrical without gross deformities. Without edema, no deformity or joint abnormality.  Neurologic:  Alert and  oriented x4;  grossly normal neurologically.  Skin:   Dry and intact without significant lesions or rashes. Psychiatric: Demonstrates good judgement and reason without  abnormal affect or behaviors.  RECENT LABS AND IMAGING: CBC    Component Value Date/Time   WBC 8.3 08/28/2016 1410   RBC 5.06 08/28/2016 1410   HGB 12.4 10/29/2016 1217   HGB 14.9 08/28/2016 1410   HCT 45.3 (H) 08/28/2016 1410   PLT 208 08/28/2016 1410   MCV 89.5 08/28/2016 1410   MCH 29.4 08/28/2016 1410   MCHC 32.9 08/28/2016 1410   RDW 13.7 08/28/2016 1410   LYMPHSABS 3,154 08/28/2016 1410   MONOABS 664 08/28/2016 1410   EOSABS 83 08/28/2016 1410   BASOSABS 0 08/28/2016 1410    CMP     Component Value Date/Time   NA 139 08/28/2016 1410   K 4.3 08/28/2016 1410   CL 103 08/28/2016 1410   CO2 25 08/28/2016 1410   GLUCOSE 88 08/28/2016 1410   BUN 15 08/28/2016 1410   CREATININE 0.92 08/28/2016 1410   CALCIUM 9.3 08/28/2016 1410   PROT 6.7 08/28/2016 1410   ALBUMIN 4.1 08/28/2016 1410   AST 18 08/28/2016 1410   ALT 15 08/28/2016  1410   ALKPHOS 70 08/28/2016 1410   BILITOT 0.7 08/28/2016 1410   GFRNONAA 78 08/28/2016 1410   GFRAA 89 08/28/2016 1410   ABDOMEN ULTRASOUND COMPLETE 09/04/17  COMPARISON:  None in PACs  FINDINGS: Gallbladder: No gallstones or wall thickening visualized. No sonographic Murphy sign noted by sonographer.  Common bile duct: Diameter: 2.8 mm  Liver: No focal lesion identified. Within normal limits in parenchymal echogenicity.  IVC: Bowel gas limits evaluation of the IVC.  Pancreas: Bowel gas limits evaluation of the pancreatic head and tail. The pancreatic body appears normal.  Spleen: Size and appearance within normal limits.  Right Kidney: Length: 8.8 cm. Echogenicity within normal limits. No mass or hydronephrosis visualized.  Left Kidney: Length: 10.3 cm. Echogenicity within normal limits. No mass or hydronephrosis visualized.  Abdominal aorta: Bowel gas limits evaluation of the abdominal aorta. No aneurysm is observed.  Other findings: There is no ascites.  IMPRESSION: No gallstones or sonographic evidence  of acute cholecystitis. If there are clinical concerns of chronic cholecystitis, a nuclear medicine hepatobiliary scan with gallbladder ejection fraction determination may be useful.  Normal appearance of the liver and spleen. Limited visualization of the pancreas.   Electronically Signed   By: David  [place] M.D.   On: 09/04/2016 11:53  Assessment: 1. Epigastric pain: For the past 3 months, no change with Omeprazole 40 mg for 10 days, constant 6/10, now some dysphagia, accompanied by nausea and a change in bowel habits,  Point drop in hemoglobin over the past 2 months, H. pylori breath test negative; gastritis versus PUD versus other 2. Nausea: With above 3. Change in bowel habits: "Stickier", and darker,  Point drop in hemoglobin; question upper GI bleed 4. Dysphagia: Patient describes multiple instances of food feeling like it gets stuck in her throat with an excruciating pain associated with this ; consider esophageal ring versus web versus stricture   Plan: 1. Scheduled patient for an EGD in [facility] with Dr. Lavon Paganini. Discussed risks, benefits, limitations and alternatives the patient agrees to proceed 2. Reviewed antireflux diet and lifestyle modifications. Provided the patient with a handout. 3. Recommend the patient restart her Omeprazole 40 mg daily, 30-60 minutes before eating breakfast. 4. Patient to follow in clinic per recommendations from Dr. Lavon Paganini after time of procedure.  Hyacinth Meeker, PA-C [facility] 11/05/2016, 8:55 AM  Cc: Jomarie Longs, PA-C

## 2016-11-05 NOTE — Patient Instructions (Addendum)
You have been scheduled for an endoscopy. Please follow written instructions given to you at your visit today. If you use inhalers (even only as needed), please bring them with you on the day of your procedure. Your physician has requested that you go to www.startemmi.com and enter the access code given to you at your visit today. This web site gives a general overview about your procedure. However, you should still follow specific instructions given to you by our office regarding your preparation for the procedure.  Start Omeprazole 40 mg daily.

## 2016-11-11 NOTE — Progress Notes (Signed)
Reviewed and agree with documentation and assessment and plan. K. Veena Marquesha Robideau , MD   

## 2016-11-12 ENCOUNTER — Encounter: Payer: Self-pay | Admitting: Gastroenterology

## 2016-11-12 ENCOUNTER — Ambulatory Visit (AMBULATORY_SURGERY_CENTER): Payer: BLUE CROSS/BLUE SHIELD | Admitting: Gastroenterology

## 2016-11-12 VITALS — BP 112/64 | HR 59 | Temp 98.0°F | Resp 15 | Ht 65.5 in | Wt 171.0 lb

## 2016-11-12 DIAGNOSIS — R110 Nausea: Secondary | ICD-10-CM

## 2016-11-12 DIAGNOSIS — R1013 Epigastric pain: Secondary | ICD-10-CM | POA: Diagnosis not present

## 2016-11-12 DIAGNOSIS — R1314 Dysphagia, pharyngoesophageal phase: Secondary | ICD-10-CM | POA: Diagnosis present

## 2016-11-12 MED ORDER — SODIUM CHLORIDE 0.9 % IV SOLN: 500.0000 mL | INTRAVENOUS | Status: AC

## 2016-11-12 NOTE — Progress Notes (Signed)
Report to PACU, RN, vss, BBS= Clear.  

## 2016-11-12 NOTE — Patient Instructions (Signed)
YOU HAD AN ENDOSCOPIC PROCEDURE TODAY AT THE [facility]:   Refer to the procedure report that was given to you for any specific questions about what was found during the examination.  If the procedure report does not answer your questions, please call your gastroenterologist to clarify.  If you requested that your care partner not be given the details of your procedure findings, then the procedure report has been included in a sealed envelope for you to review at your convenience later.  YOU SHOULD EXPECT: Some feelings of bloating in the abdomen. Passage of more gas than usual.  Walking can help get rid of the air that was put into your GI tract during the procedure and reduce the bloating.   Please Note:  You might notice some irritation and congestion in your nose or some drainage.  This is from the oxygen used during your procedure.  There is no need for concern and it should clear up in a day or so.  SYMPTOMS TO REPORT IMMEDIATELY:    Following upper endoscopy (EGD)  Vomiting of blood or coffee ground material  New chest pain or pain under the shoulder blades  Painful or persistently difficult swallowing  New shortness of breath  Fever of 100F or higher  Black, tarry-looking stools  For urgent or emergent issues, a gastroenterologist can be reached at any hour by calling [phone].   DIET:  We do recommend a small meal at first, but then you may proceed to your regular diet.  Drink plenty of fluids but you should avoid alcoholic beverages for 24 hours.  ACTIVITY:  You should plan to take it easy for the rest of today and you should NOT DRIVE or use heavy machinery until tomorrow (because of the sedation medicines used during the test).    FOLLOW UP: Our staff will call the number listed on your records the next business day following your procedure to check on you and address any questions or concerns that you may have regarding the information given to you  following your procedure. If we do not reach you, we will leave a message.  However, if you are feeling well and you are not experiencing any problems, there is no need to return our call.  We will assume that you have returned to your regular daily activities without incident.  If any biopsies were taken you will be contacted by phone or by letter within the next 1-3 weeks.  Please call us at [phone] if you have not heard about the biopsies in 3 weeks.    SIGNATURES/CONFIDENTIALITY: You and/or your care partner have signed paperwork which will be entered into your electronic medical record.  These signatures attest to the fact that that the information above on your After Visit Summary has been reviewed and is understood.  Full responsibility of the confidentiality of this discharge information lies with you and/or your care-partner.  Take your FD gard twice daily for relief of gastrointestinal pain or bloating per Dr. Lavon PaganiniNandigam.

## 2016-11-12 NOTE — Op Note (Signed)
[facility] Patient Name: Terri Foster Procedure Date: 11/12/2016 8:32 AM MRN: [id number] Endoscopist: Napoleon Form , MD Age: 41 Referring MD:  Date of Birth: 02-11-1976 Gender: Female Account #: [id number] Procedure:                Upper GI endoscopy Indications:              Dysphagia, Dyspepsia, Nausea Medicines:                Monitored Anesthesia Care Procedure:                Pre-Anesthesia Assessment:                           - Prior to the procedure, a History and Physical                            was performed, and patient medications and                            allergies were reviewed. The patient's tolerance of                            previous anesthesia was also reviewed. The risks                            and benefits of the procedure and the sedation                            options and risks were discussed with the patient.                            All questions were answered, and informed consent                            was obtained. Prior Anticoagulants: The patient has                            taken no previous anticoagulant or antiplatelet                            agents. ASA Grade Assessment: II - A patient with                            mild systemic disease. After reviewing the risks                            and benefits, the patient was deemed in                            satisfactory condition to undergo the procedure.                           After obtaining informed consent, the endoscope was  passed under direct vision. Throughout the                            procedure, the patient's blood pressure, pulse, and                            oxygen saturations were monitored continuously. The                            Endoscope was introduced through the mouth, and                            advanced to the second part of duodenum. The upper                            GI endoscopy was  accomplished without difficulty.                            The patient tolerated the procedure well. Scope In: Scope Out: Findings:                 The esophagus was normal.                           The stomach was normal.                           The first portion of the duodenum and second                            portion of the duodenum were normal. Biopsies for                            histology were taken with a cold forceps for                            evaluation of celiac disease. Complications:            No immediate complications. Estimated Blood Loss:     Estimated blood loss was minimal. Impression:               - Normal esophagus.                           - Normal stomach.                           - Normal first portion of the duodenum and second                            portion of the duodenum. Biopsied. Recommendation:           - Patient has a contact number available for                            emergencies. The signs and symptoms of potential  delayed complications were discussed with the                            patient. Return to normal activities tomorrow.                            Written discharge instructions were provided to the                            patient.                           - Resume previous diet.                           - Continue present medications.                           - Await pathology results.                           - No repeat upper endoscopy.                           - Return to GI office PRN.                           -If continues to have persistent dysphagia consider                            esophageal manometry to evaluate for possible                            esophageal dysmotility Napoleon Form, MD 11/12/2016 8:46:18 AM This report has been signed electronically.

## 2016-11-12 NOTE — Progress Notes (Signed)
Called to room to assist during endoscopic procedure.  Patient ID and intended procedure confirmed with present staff. Received instructions for my participation in the procedure from the performing physician.  

## 2016-11-13 ENCOUNTER — Telehealth: Payer: Self-pay | Admitting: *Deleted

## 2016-11-13 ENCOUNTER — Telehealth: Payer: Self-pay

## 2016-11-13 NOTE — Telephone Encounter (Signed)
No answer left message will attempt to call back later this afternoon. Sm 

## 2016-11-13 NOTE — Telephone Encounter (Signed)
  Follow up Call-  Call back number 11/12/2016  Post procedure Call Back phone  # [phone]  Permission to leave phone message Yes  Some recent data might be hidden     Patient questions:  Do you have a fever, pain , or abdominal swelling? No. Pain Score  0 *  Have you tolerated food without any problems? Yes.    Have you been able to return to your normal activities? Yes.    Do you have any questions about your discharge instructions: Diet   No. Medications  No. Follow up visit  No.  Do you have questions or concerns about your Care? Yes.  Patient asked when she can expect to have a normal bowel movement. Patient had upper endoscopy only. Instructed patient she could expect her normal bowel routine to return anytime.  Actions: * If pain score is 4 or above: No action needed, pain <4.

## 2016-11-25 ENCOUNTER — Encounter: Payer: Self-pay | Admitting: Gastroenterology

## 2017-11-22 IMAGING — US US ABDOMEN COMPLETE
1 series · 13 of 25 positions shown · non-contrast
Comparison: None in PACs

CLINICAL DATA: Right upper quadrant abdominal pain with nausea for
the past month. History of appendectomy.

EXAM:
ABDOMEN ULTRASOUND COMPLETE

[Series 1: us abdomen complete · 0.14mm/px · 13 of 76 slices shown]
[im 1/76]
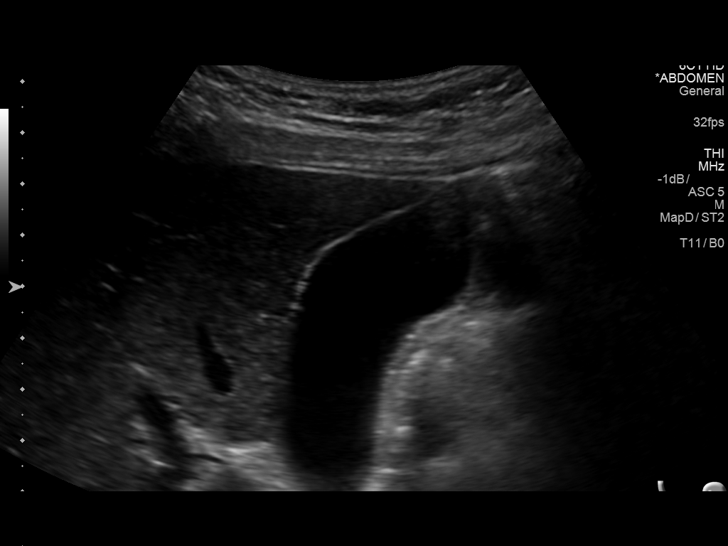
[im 7/76]
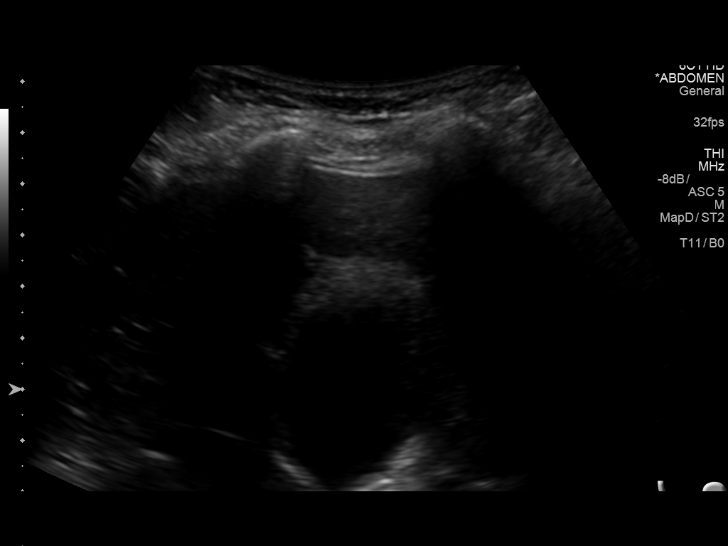
[im 13/76]
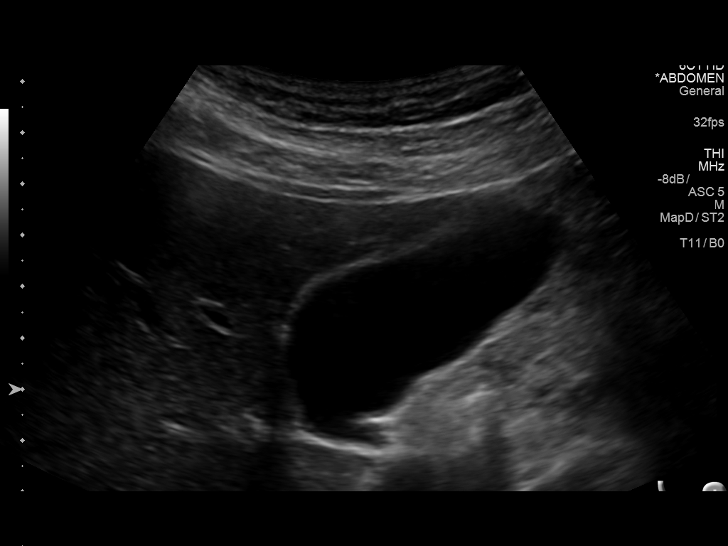
[im 19/76]
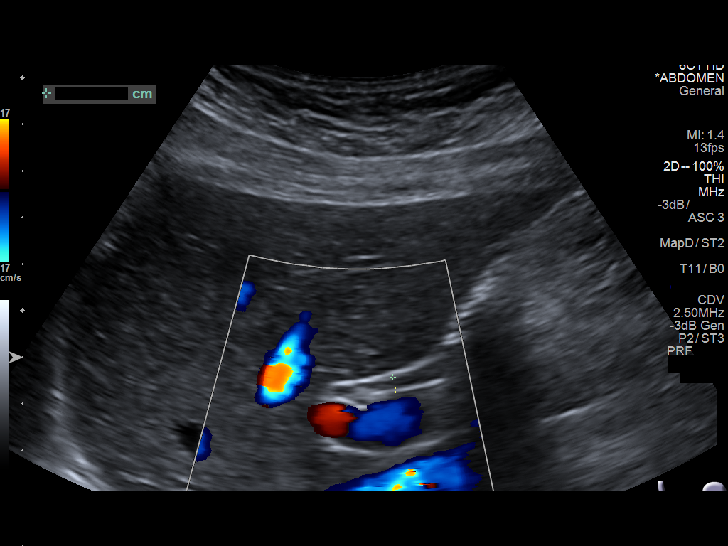
[im 26/76]
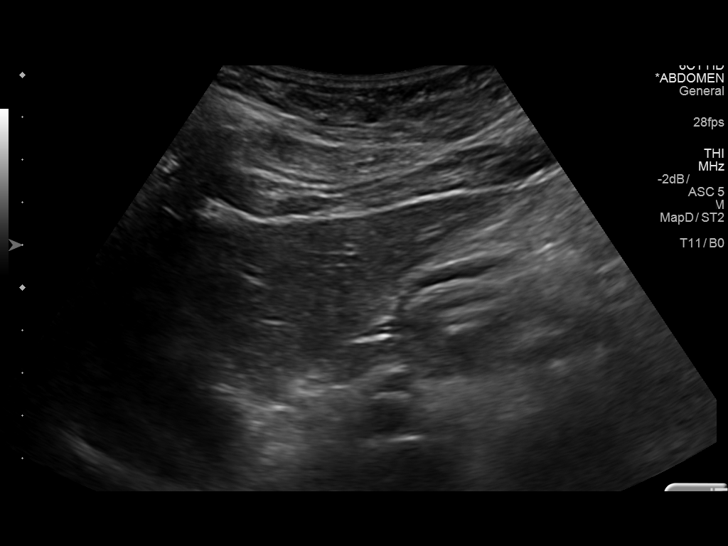
[im 32/76]
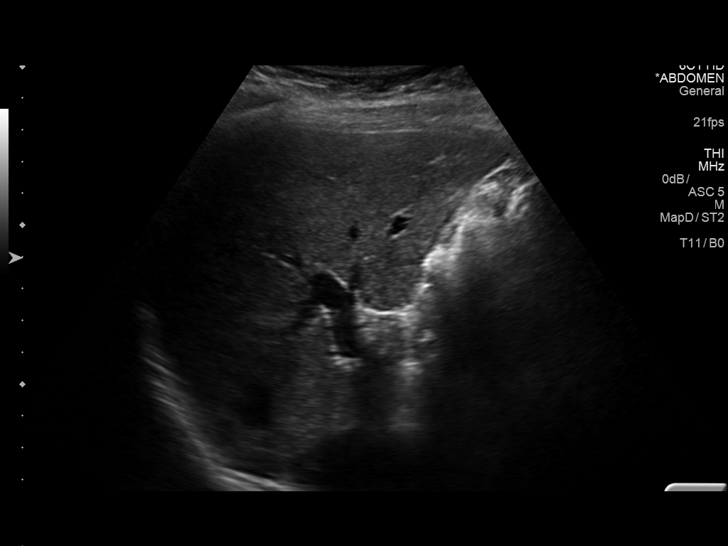
[im 38/76]
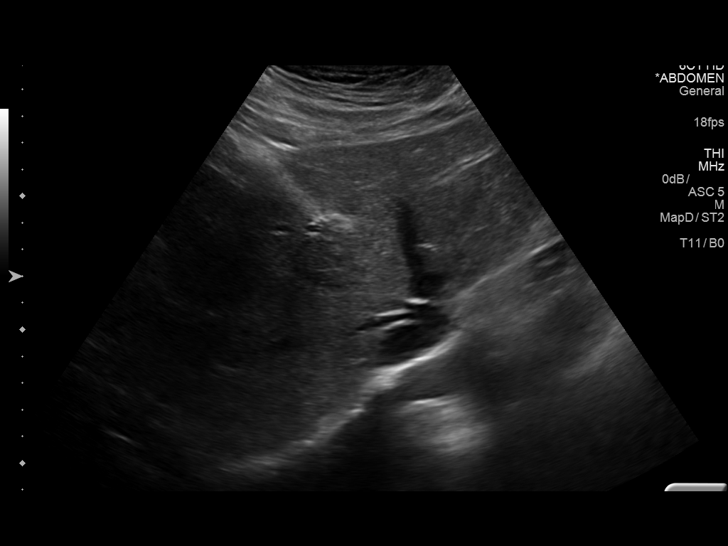
[im 44/76]
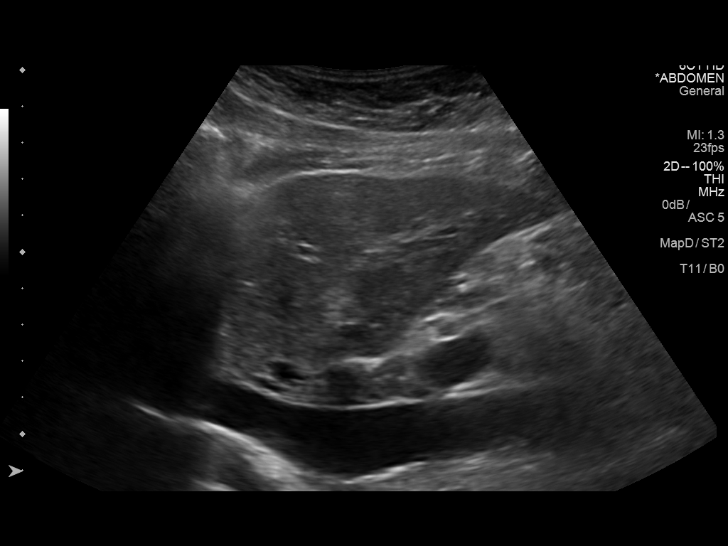
[im 51/76]
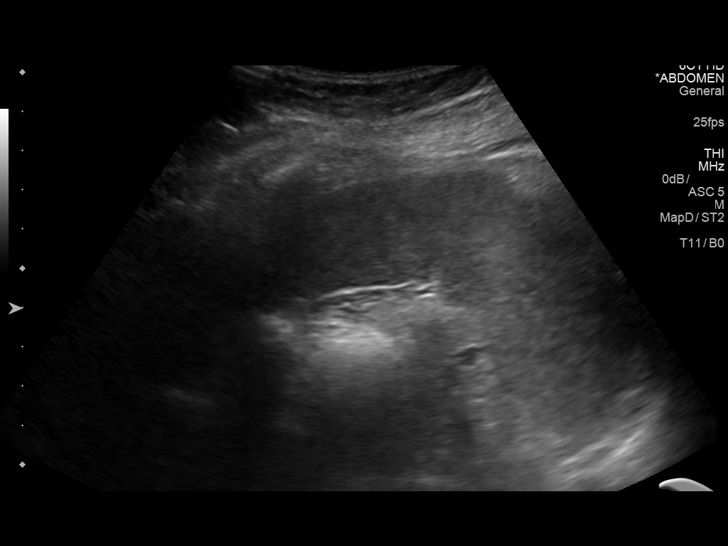
[im 57/76]
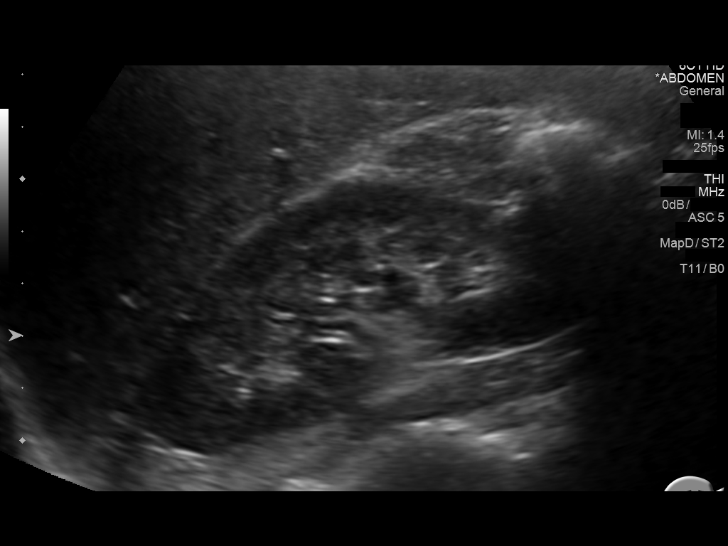
[im 63/76]
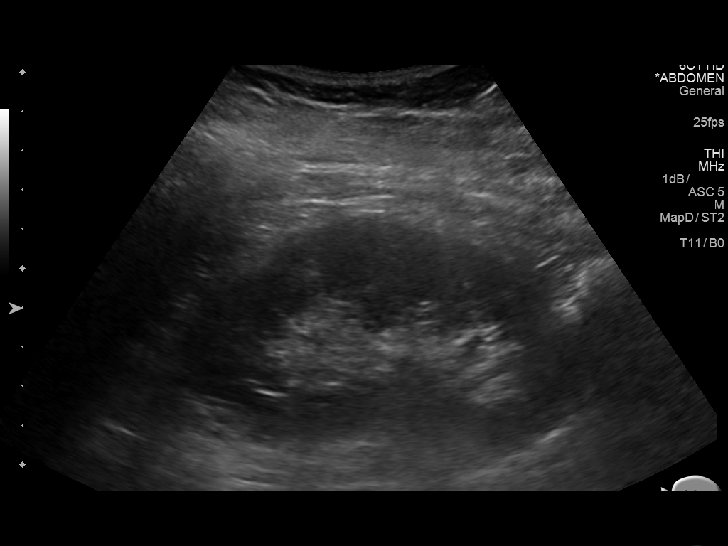
[im 69/76]
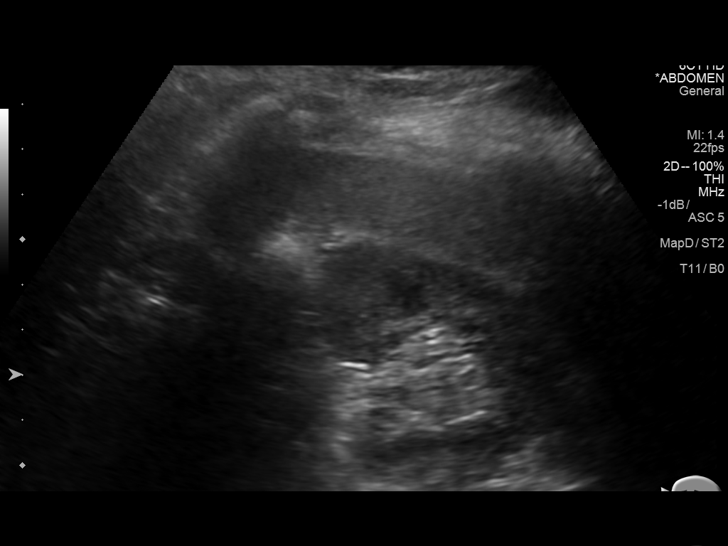
[im 76/76]
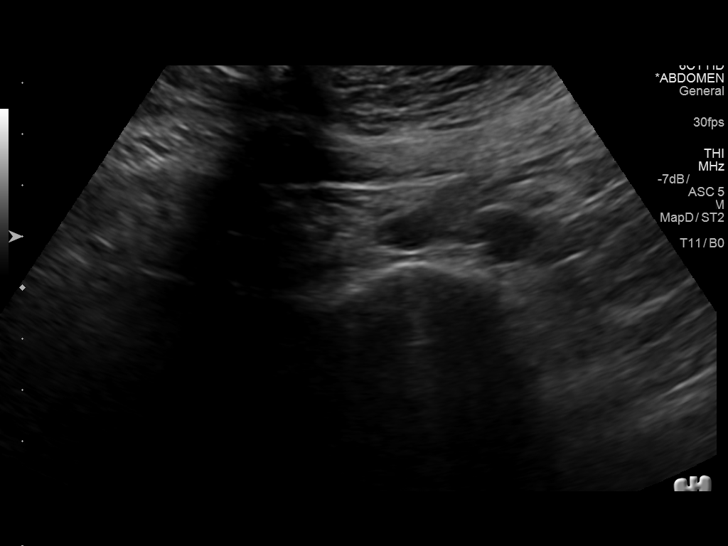

[13 of 25 positions shown; findings below may reference images not displayed]

FINDINGS: Gallbladder: No gallstones or wall thickening visualized. No
sonographic Murphy sign noted by sonographer.

Common bile duct: Diameter: 2.8 mm

Liver: No focal lesion identified. Within normal limits in
parenchymal echogenicity.

IVC: Bowel gas limits evaluation of the IVC.

Pancreas: Bowel gas limits evaluation of the pancreatic head and
tail. The pancreatic body appears normal.

Spleen: Size and appearance within normal limits.

Right Kidney: Length: 8.8 cm. Echogenicity within normal limits. No
mass or hydronephrosis visualized.

Left Kidney: Length: 10.3 cm. Echogenicity within normal limits. No
mass or hydronephrosis visualized.

Abdominal aorta: Bowel gas limits evaluation of the abdominal aorta.
No aneurysm is observed.

Other findings: There is no ascites.
IMPRESSION: No gallstones or sonographic evidence of acute cholecystitis. If
there are clinical concerns of chronic cholecystitis, a nuclear
medicine hepatobiliary scan with gallbladder ejection fraction
determination may be useful.

Normal appearance of the liver and spleen. Limited visualization of
the pancreas.

## 2017-12-03 ENCOUNTER — Ambulatory Visit (INDEPENDENT_AMBULATORY_CARE_PROVIDER_SITE_OTHER): Payer: BLUE CROSS/BLUE SHIELD | Admitting: Physician Assistant

## 2017-12-03 ENCOUNTER — Ambulatory Visit (HOSPITAL_BASED_OUTPATIENT_CLINIC_OR_DEPARTMENT_OTHER)
Admission: RE | Admit: 2017-12-03 | Discharge: 2017-12-03 | Disposition: A | Discharge status: Home / Self Care | Payer: BLUE CROSS/BLUE SHIELD | Source: Ambulatory Visit | Attending: Physician Assistant | Admitting: Physician Assistant

## 2017-12-03 ENCOUNTER — Ambulatory Visit (HOSPITAL_BASED_OUTPATIENT_CLINIC_OR_DEPARTMENT_OTHER): Admission: RE | Admit: 2017-12-03 | Payer: BLUE CROSS/BLUE SHIELD | Source: Ambulatory Visit

## 2017-12-03 ENCOUNTER — Encounter: Payer: Self-pay | Admitting: Physician Assistant

## 2017-12-03 VITALS — BP 122/64 | HR 84 | Ht 65.5 in | Wt 174.0 lb

## 2017-12-03 DIAGNOSIS — R1032 Left lower quadrant pain: Secondary | ICD-10-CM | POA: Diagnosis not present

## 2017-12-03 DIAGNOSIS — N926 Irregular menstruation, unspecified: Secondary | ICD-10-CM

## 2017-12-03 DIAGNOSIS — R110 Nausea: Secondary | ICD-10-CM

## 2017-12-03 DIAGNOSIS — R9389 Abnormal findings on diagnostic imaging of other specified body structures: Secondary | ICD-10-CM | POA: Diagnosis not present

## 2017-12-03 LAB — POCT URINE PREGNANCY: PREG TEST UR: NEGATIVE

## 2017-12-03 LAB — POCT URINALYSIS DIPSTICK
Bilirubin, UA: NEGATIVE
GLUCOSE UA: NEGATIVE
Ketones, UA: NEGATIVE
LEUKOCYTES UA: NEGATIVE
Nitrite, UA: NEGATIVE
Protein, UA: NEGATIVE
SPEC GRAV UA: 1.02 (ref 1.010–1.025)
Urobilinogen, UA: 0.2 E.U./dL
pH, UA: 5.5 (ref 5.0–8.0)

## 2017-12-03 NOTE — Progress Notes (Signed)
Call pt: sall cyst on the right side closer to the uterus and no dominant cyst on the left but multiple prominent follicles. No definite reason for pain but certainly could be the ovary cramping if multiple follicles are budding. Do not have labs right now. Will call back with those.

## 2017-12-03 NOTE — Progress Notes (Signed)
Subjective:    Patient ID: Terri Foster, female    DOB: 02/07/1976, 42 y.o.   MRN: [id number]  HPI Terri Foster is a 42 year old female that presents with a chief complaint of LLQ abdominal pain. She states that she has been having a cramping/discomfort pain since 3/9 but a change in intensity this morning to a sharp pain. She was supposed to started her period on 3/9 (has always had normal, regular menstrual cycles) and a negative home pregnancy test on 3/12. She states that her pain is worse with movement and feels a little better if she is resting but the pain is not significant enough to be debilitating. She denies any flank pain, fevers, chills, or vomiting but she has been nauseated. She denies any urinary symptoms, constipation, diarrhea, blood in her stool. She is not on any OCP.    Prior pelvic/abdominal history include ovarian cyst at age 42 that did not require surgery, an exploratory laparoscopy, appendectomy, and a D&C.  [facility] She states that this pain is different and that pain has resolved after an upper endoscopy and discontinuing whey protein.  .. Active Ambulatory Problems    Diagnosis Date Noted  . Cervical paraspinal muscle spasm 03/27/2016  . Antiphospholipid antibody syndrome (HCC) 04/26/2016  . Leg length discrepancy 04/26/2016  . Abdominal pain 08/30/2016  . Hematuria 08/30/2016  . Melena 10/29/2016   Resolved Ambulatory Problems    Diagnosis Date Noted  . No Resolved Ambulatory Problems   No Additional Past Medical History      Review of Systems  Constitutional: Negative for activity change, appetite change, chills, fatigue and fever.  Respiratory: Negative for chest tightness and shortness of breath.   Cardiovascular: Negative for chest pain.  Gastrointestinal: Positive for abdominal pain and nausea. Negative for anal bleeding, blood in stool, constipation, diarrhea, rectal pain and vomiting.   Genitourinary: Positive for menstrual problem and pelvic pain. Negative for decreased urine volume, dysuria, flank pain, frequency, hematuria, urgency, vaginal bleeding, vaginal discharge and vaginal pain.  Musculoskeletal: Negative for back pain and myalgias.       Objective:   Physical Exam  Constitutional: She is oriented to person, place, and time. She appears well-developed and well-nourished. No distress.  HENT:  Head: Normocephalic and atraumatic.  Eyes: Conjunctivae are normal.  Neck: Normal range of motion.  Cardiovascular: Normal rate, regular rhythm and normal heart sounds.  Pulmonary/Chest: Effort normal and breath sounds normal. No respiratory distress.  Abdominal: Soft. Bowel sounds are normal. She exhibits no mass. There is tenderness ( very mild) in the suprapubic area and left lower quadrant. There is no rebound and no guarding.  Neurological: She is alert and oriented to person, place, and time.  Skin: Skin is warm and dry.  Psychiatric: She has a normal mood and affect. Her behavior is normal.  Nursing note and vitals reviewed.         Assessment & Plan:  Marland Kitchen.Marland Kitchen.Diagnoses and all orders for this visit:  Left lower quadrant pain -     POCT Urinalysis Dipstick -     CBC with Differential/Platelet -     COMPLETE METABOLIC PANEL WITH GFR -     B-HCG Quant -     US PELVIS (TRANSABDOMINAL ONLY) -     US PELVIS TRANSVANGINAL NON-OB (TV ONLY) -     Urine Culture  Missed period -     POCT urine pregnancy -  B-HCG Quant -     Urine Culture  Nausea -     CBC with Differential/Platelet -     COMPLETE METABOLIC PANEL WITH GFR -     B-HCG Quant -     US PELVIS (TRANSABDOMINAL ONLY) -     US PELVIS TRANSVANGINAL NON-OB (TV ONLY) -     Urine Culture   .Marland Kitchen Results for orders placed or performed in visit on 12/03/17  POCT urine pregnancy  Result Value Ref Range   Preg Test, Ur Negative Negative  POCT Urinalysis Dipstick  Result Value Ref Range   Color, UA  yellow    Clarity, UA clear    Glucose, UA neg    Bilirubin, UA neg    Ketones, UA neg    Spec Grav, UA 1.020 1.010 - 1.025   Blood, UA small    pH, UA 5.5 5.0 - 8.0   Protein, UA neg    Urobilinogen, UA 0.2 0.2 or 1.0 E.U./dL   Nitrite, UA neg    Leukocytes, UA Negative Negative   Appearance     Odor     Unclear etiology. Need to rule out ovarian cyst vs diverticulitis vs ectopic vs fibroid vs UTI vs STD infection  Positive for blood and negative for leuks/nitrates. Will culture but unlikely infection.  Pt declined STD testing.  UPT was negative. Will get serum hcg since not using any birth control.   Pt will go to [facility] to have pelvic/transvaginal u/s.   Labs ordered to look for any infection.   Consider ibuprofen for pain up to 800mg  up to 3 times a day.   Marland Kitchen.Spent 30 minutes with patient and greater than 50 percent of visit spent counseling patient regarding treatment plan.

## 2017-12-03 NOTE — Patient Instructions (Signed)
4:30 today at [facility] for u/s.  GET blood drawn.

## 2017-12-04 LAB — COMPLETE METABOLIC PANEL WITH GFR
AG Ratio: 1.6 (calc) (ref 1.0–2.5)
ALBUMIN MSPROF: 4.1 g/dL (ref 3.6–5.1)
ALT: 18 U/L (ref 6–29)
AST: 21 U/L (ref 10–30)
Alkaline phosphatase (APISO): 68 U/L (ref 33–115)
BUN: 21 mg/dL (ref 7–25)
CALCIUM: 9.3 mg/dL (ref 8.6–10.2)
CO2: 31 mmol/L (ref 20–32)
CREATININE: 0.94 mg/dL (ref 0.50–1.10)
Chloride: 105 mmol/L (ref 98–110)
GFR, EST AFRICAN AMERICAN: 86 mL/min/{1.73_m2} (ref >=60)
GFR, EST NON AFRICAN AMERICAN: 74 mL/min/{1.73_m2} (ref >=60)
GLOBULIN: 2.5 g/dL (ref 1.9–3.7)
Glucose, Bld: 84 mg/dL (ref 65–99)
POTASSIUM: 4 mmol/L (ref 3.5–5.3)
SODIUM: 140 mmol/L (ref 135–146)
TOTAL PROTEIN: 6.6 g/dL (ref 6.1–8.1)
Total Bilirubin: 0.4 mg/dL (ref 0.2–1.2)

## 2017-12-04 LAB — CBC WITH DIFFERENTIAL/PLATELET
BASOS PCT: 0.5 %
Basophils Absolute: 49 cells/uL (ref 0–200)
Eosinophils Absolute: 78 cells/uL (ref 15–500)
Eosinophils Relative: 0.8 %
HEMATOCRIT: 39.7 % (ref 35.0–45.0)
HEMOGLOBIN: 13.8 g/dL (ref 11.7–15.5)
LYMPHS ABS: 2666 {cells}/uL (ref 850–3900)
MCH: 30.9 pg (ref 27.0–33.0)
MCHC: 34.8 g/dL (ref 32.0–36.0)
MCV: 88.8 fL (ref 80.0–100.0)
MPV: 12 fL (ref 7.5–12.5)
Monocytes Relative: 6.8 %
NEUTROS ABS: 6341 {cells}/uL (ref 1500–7800)
NEUTROS PCT: 64.7 %
Platelets: 206 10*3/uL (ref 140–400)
RBC: 4.47 10*6/uL (ref 3.80–5.10)
RDW: 12.1 % (ref 11.0–15.0)
Total Lymphocyte: 27.2 %
WBC: 9.8 10*3/uL (ref 3.8–10.8)
WBCMIX: 666 {cells}/uL (ref 200–950)

## 2017-12-04 LAB — URINE CULTURE
MICRO NUMBER:: 90352056
SPECIMEN QUALITY:: ADEQUATE

## 2017-12-04 LAB — HCG, QUANTITATIVE, PREGNANCY: HCG, Total, QN: <2 m[IU]/mL

## 2017-12-05 NOTE — Progress Notes (Signed)
Call pt: confirmed no pregnancy. Kidney and liver look good. No bacteria found in culture other than normal bacteria. How is pain? When is GYN appt that you have scheduled?

## 2017-12-19 LAB — HM PAP SMEAR: HM PAP: NEGATIVE

## 2017-12-26 ENCOUNTER — Ambulatory Visit (INDEPENDENT_AMBULATORY_CARE_PROVIDER_SITE_OTHER): Payer: BLUE CROSS/BLUE SHIELD | Admitting: Family Medicine

## 2017-12-26 ENCOUNTER — Encounter: Payer: Self-pay | Admitting: Family Medicine

## 2017-12-26 VITALS — BP 104/71 | HR 78 | Temp 97.7°F | Wt 173.9 lb

## 2017-12-26 DIAGNOSIS — Z91018 Allergy to other foods: Secondary | ICD-10-CM

## 2017-12-26 DIAGNOSIS — S30860A Insect bite (nonvenomous) of lower back and pelvis, initial encounter: Secondary | ICD-10-CM | POA: Diagnosis not present

## 2017-12-26 DIAGNOSIS — W57XXXA Bitten or stung by nonvenomous insect and other nonvenomous arthropods, initial encounter: Secondary | ICD-10-CM

## 2017-12-26 MED ORDER — DOXYCYCLINE HYCLATE 100 MG PO TABS: 100.0000 mg | ORAL_TABLET | Freq: Two times a day (BID) | ORAL | 0 refills | Status: DC

## 2017-12-26 NOTE — Progress Notes (Signed)
Terri Foster is a 42 y.o. female who presents to [facility] today for tick bite. Deaja found a tick on her right low back a few days ago.  She thinks that this was there for about a week.  She notes with the removal of the tick she developed a red irritated area of skin surrounding the tick bite.  She denies an annular lesion but notes that she had spreading erythema.  Since then she has had occasional itching across her extremities but denies any significant persistent rash.  She notes however when she eats she experiences flushing and nausea.  Otherwise she denies significant headaches fevers or chills or body aches.  She feels pretty well otherwise.  She is tried Zyrtec and Benadryl which does help with the itching at times.  Patient brings in the tick today for inspection.   Past Medical History:  Diagnosis Date  . Antiphospholipid antibody syndrome (HCC) 04/26/2016   Past Surgical History:  Procedure Laterality Date  . APPENDECTOMY    . HEMORRHOID SURGERY  1996  . KNEE ARTHROSCOPY Right   . LAPAROSCOPIC ABDOMINAL EXPLORATION     Fertility  . TONSILLECTOMY AND ADENOIDECTOMY  age 67  . WRIST SURGERY Right 2008   Social History   Tobacco Use  . Smoking status: Never Smoker  . Smokeless tobacco: Never Used  Substance Use Topics  . Alcohol use: No    Alcohol/week: 0.0 oz   family history includes Breast cancer in her maternal grandmother; Colon cancer in her paternal grandmother; Colon polyps in her father; Diabetes in her maternal grandmother; Heart disease in her maternal grandfather and maternal grandmother; Hypertension in her father; Irritable bowel syndrome in her mother; Kidney disease in her other; Uterine cancer in her paternal grandmother.  ROS as above:  Medications: Current Outpatient Medications  Medication Sig Dispense Refill  . doxycycline  (VIBRA-TABS) 100 MG tablet Take 1 tablet (100 mg total) by mouth 2 (two) times daily. 14 tablet 0   Current Facility-Administered Medications  Medication Dose Route Frequency Provider Last Rate Last Dose  . 0.9 %  sodium chloride infusion  500 mL Intravenous Continuous Nandigam, Eleonore Chiquito, MD       Allergies  Allergen Reactions  . Erythromycin Hives  . Azithromycin Rash    Health Maintenance Health Maintenance  Topic Date Due  . HIV Screening  09/24/1989  . PAP SMEAR  09/25/1995  . INFLUENZA VACCINE  04/16/2018  . MAMMOGRAM  07/06/2018  . TETANUS/TDAP  09/16/2020     Exam:  BP 104/71   Pulse 78   Temp 97.7 F (36.5 C) (Oral)   Wt 173 lb 14.4 oz (78.9 kg)   BMI 28.50 kg/m  Gen: Well NAD nontoxic appearing HEENT: EOMI,  MMM Lungs: Normal work of breathing. CTABL Heart: RRR no MRG Abd: NABS, Soft. Nondistended, Nontender Exts: Brisk capillary refill, warm and well perfused.  Skin: Small 1 cm erythematous papule right low back with no surrounding rash or any other lesions.  No other significant skin rash.  No purpura or petechiae present  Inspection of the tick reveals a dog tick    Assessment and Plan: 42 y.o. female with tick bite with local reaction.  Patient has some systemic symptoms but none consistent with [facility] spotted fever.  Plan for prophylaxis treatment with doxycycline.    Additionally patient is describing symptoms consistent with alpha galactose intolerance or allergy.  Brief education  and recommend avoiding pork or beef products for the next month with reintroduction of those food items in the near future if possible.  Contact or follow-up if symptoms do not improve.  Additionally will send records request for a Pap smear  No orders of the defined types were placed in this encounter.  Meds ordered this encounter  Medications  . doxycycline (VIBRA-TABS) 100 MG tablet    Sig: Take 1 tablet (100 mg total) by mouth 2 (two) times daily.     Dispense:  14 tablet    Refill:  0     Discussed warning signs or symptoms. Please see discharge instructions. Patient expresses understanding.

## 2017-12-26 NOTE — Patient Instructions (Signed)
Thank you for coming in today. Take the doxycycline twice daily for 1 week.  Do not take with milk, calcium or iron or multivitamins.  It is ok to take with a little bit of food.  Continue benadryl or zyrtec for allergy symptoms.  Avoid pork or beef for 1 month and then try a gradual reintroduction.  Research Alpha Galactose allergy due to ticks Alpha-gal  There is a radiolab podcast about alpha-gal   Tick Bite Information, Adult Ticks are insects that draw blood for food. Most ticks live in shrubs and grassy areas. They climb onto people and animals that brush against the leaves and grasses that they rest on. Then they bite, attaching themselves to the skin. Most ticks are harmless, but some ticks carry germs that can spread to a person through a bite and cause a disease. To reduce your risk of getting a disease from a tick bite, it is important to take steps to prevent tick bites. It is also important to check for ticks after being outdoors. If you find that a tick has attached to you, watch for symptoms of disease. How can I prevent tick bites? Take these steps to help prevent tick bites when you are outdoors in an area where ticks are found:  Use insect repellent that has DEET (20% or higher), picaridin, or IR3535 in it. Use it on: ? Skin that is showing. ? The top of your boots. ? Your pant legs. ? Your sleeve cuffs.  For repellent products that contain permethrin, follow product instructions. Use these products on: ? Clothing. ? Gear. ? Boots. ? Tents.  Wear protective clothing. Long sleeves and long pants offer the best protection from ticks.  Wear light-colored clothing so you can see ticks more easily.  Tuck your pant legs into your socks.  If you go walking on a trail, stay in the middle of the trail so your skin, hair, and clothing do not touch the bushes.  Avoid walking through areas with long grass.  Check for ticks on your clothing, hair, and skin often while you  are outside, and check again before you go inside. Make sure to check the places that ticks attach themselves most often. These places include the scalp, neck, armpits, waist, groin, and joint areas. Ticks that carry a disease called Lyme disease have to be attached to the skin for 24-48 hours. Checking for ticks every day will lessen your risk of this and other diseases.  When you come indoors, wash your clothes and take a shower or a bath right away. Dry your clothes in a dryer on high heat for at least 60 minutes. This will kill any ticks in your clothes.  What is the proper way to remove a tick? If you find a tick on your body, remove it as soon as possible. Removing a tick sooner rather than later can prevent germs from passing from the tick to your body. To remove a tick that is crawling on your skin but has not bitten:  Go outdoors and brush the tick off.  Remove the tick with tape or a lint roller.  To remove a tick that is attached to your skin:  Wash your hands.  If you have latex gloves, put them on.  Use tweezers, curved forceps, or a tick-removal tool to gently grasp the tick as close to your skin and the tick's head as possible.  Gently pull with steady, upward pressure until the tick lets go. When removing  the tick: ? Take care to keep the tick's head attached to its body. ? Do not twist or jerk the tick. This can make the tick's head or mouth break off. ? Do not squeeze or crush the tick's body. This could force disease-carrying fluids from the tick into your body.  Do not try to remove a tick with heat, alcohol, petroleum jelly, or fingernail polish. Using these methods can cause the tick to salivate and regurgitate into your bloodstream, increasing your risk of getting a disease. What should I do after removing a tick?  Clean the bite area with soap and water, rubbing alcohol, or an iodine scrub.  If an antiseptic cream or ointment is available, apply a small amount to  the bite site.  Wash and disinfect any instruments that you used to remove the tick. How should I dispose of a tick? To dispose of a live tick, use one of these methods:  Place it in rubbing alcohol.  Place it in a sealed bag or container.  Wrap it tightly in tape.  Flush it down the toilet.  Contact a health care provider if:  You have symptoms of a disease after a tick bite. Symptoms of a tick-borne disease can occur from moments after the tick bites to up to 30 days after a tick is removed. Symptoms include: ? Muscle, joint, or bone pain. ? Difficulty walking or moving your legs. ? Numbness in the legs. ? Paralysis. ? Red rash around the tick bite area that is shaped like a target or a "bull's-eye." ? Redness and swelling in the area of the tick bite. ? Fever. ? Repeated vomiting. ? Diarrhea. ? Weight loss. ? Tender, swollen lymph glands. ? Shortness of breath. ? Cough. ? Pain in the abdomen. ? Headache. ? Abnormal tiredness. ? A change in your level of consciousness. ? Confusion. Get help right away if:  You are not able to remove a tick.  A part of a tick breaks off and gets stuck in your skin.  Your symptoms get worse. Summary  Ticks may carry germs that can spread to a person through a bite and cause disease.  Wear protective clothing and use insect repellent to prevent tick bites. Follow product instructions.  If you find a tick on your body, remove it as soon as possible. If the tick is attached, do not try to remove with heat, alcohol, petroleum jelly, or fingernail polish.  Remove the attached tick using tweezers, curved forceps, or a tick-removal tool. Gently pull with steady, upward pressure until the tick lets go. Do not twist or jerk the tick. Do not squeeze or crush the tick's body.  If you have symptoms after being bitten by a tick, contact a health care provider. This information is not intended to replace advice given to you by your health care  provider. Make sure you discuss any questions you have with your health care provider. Document Released: 08/30/2000 Document Revised: 06/14/2016 Document Reviewed: 06/14/2016 Elsevier Interactive Patient Education  2018 [employer]    [facility] Spotted Fever [facility] spotted fever is an illness that is spread to people by infected ticks. The illness causes flulike symptoms and a reddish-purple rash. This illness can quickly become very serious. Treatment must be started right away. When the illness is not treated right away, it can sometimes lead to long-term health problems or even death. This illness is most common during warm weather when ticks are most active. What are the  causes? [facility] spotted fever is caused by a type of bacteria that is called Rickettsia rickettsii. This type of bacteria is carried by [place] dog ticks and [employer]. People get infected through a bite from a tick that is infected with the bacteria. The bite is painless, and it frequently goes unnoticed. The bacteria can also infect a person when tick blood or tick feces get into a person's body through damaged skin. A tick bite is not necessary for an infection to occur. People can get [facility] spotted fever if they get a tick's blood or body fluids on their skin in the area of a small cut or sore. This could happen while removing a tick from another person or a dog. The infection is not contagious, and it cannot be spread (transmitted) from person to person. What are the signs or symptoms? Symptoms may begin 2-14 days after a tick bite. The most common early symptoms are:  Fever.  Muscle aches.  Headache.  Nausea.  Vomiting.  Poor appetite.  Abdominal pain.  The reddish-purple rash usually appears 3-5 days after the first symptoms begin. The rash often starts on the wrists and ankles. It may then spread to the palms, the soles of the feet, the legs, and the  trunk. How is this diagnosed? Diagnosis is based on a physical exam, medical history, and blood tests. Your health care provider may suspect [facility] spotted fever in one of these cases:  If you have recently been bitten by a tick.  If you have been in areas that have a lot of ticks or in areas where the disease is common.  How is this treated? It is important to begin treatment right away. Treatment will usually involve the use of antibiotic medicines. In some cases, your health care provider may begin treatment before the diagnosis is confirmed. If your symptoms are severe, a hospital stay may be needed. Follow these instructions at home:  Rest as much as possible until you feel better.  Take medicines only as directed by your health care provider.  Take your antibiotic medicine as directed by your health care provider. Finish the antibiotic even if you start to feel better.  Drink enough fluid to keep your urine clear or pale yellow.  Keep all follow-up visits as directed by your health care provider. This is important. How is this prevented? Avoiding tick bites can help to prevent this illness. Take these steps to avoid tick bites when you are outdoors:  Be aware that most ticks live in shrubs, low tree branches, and grassy areas. A tick can climb onto your body when you make contact with leaves or grass where the tick is waiting.  Wear protective clothing. Long sleeves and long pants are best.  Wear white clothes so you can see ticks more easily.  Tuck your pant legs into your socks.  If you go walking on a trail, stay in the middle of the trail to avoid brushing against bushes.  Avoid walking through areas that have long grass.  Put insect repellent on all exposed skin and along boot tops, pant legs, and sleeve cuffs.  Check clothing, hair, and skin repeatedly and before going inside.  Check family members and pets for ticks.  Brush off any ticks that are not  attached.  Take a shower or a bath as soon as possible after you have been outdoors. Check your skin for ticks. The most common places on the body where  ticks attach themselves are the scalp, neck, armpits, waist, and groin.  You can also greatly reduce your chances of getting [facility] spotted fever if you remove attached ticks as soon as possible. To remove an attached tick, use a forceps or fine-point tweezers to detach the intact tick without leaving its mouth parts in the skin. The wound from the tick bite should be washed after the tick has been removed. Contact a health care provider if:  You have drainage, swelling, or increased redness or pain in the area of the rash. Get help right away if:  You have chest pain.  You have shortness of breath.  You have a severe headache.  You have a seizure.  You have severe abdominal pain.  You are feeling confused.  You are bruising easily.  You have bleeding from your gums.  You have blood in your stool. This information is not intended to replace advice given to you by your health care provider. Make sure you discuss any questions you have with your health care provider. Document Released: 12/15/2000 Document Revised: 02/08/2016 Document Reviewed: 04/18/2014 Elsevier Interactive Patient Education  2018 [employer].

## 2017-12-30 ENCOUNTER — Encounter: Payer: Self-pay | Admitting: Family Medicine

## 2018-02-02 ENCOUNTER — Encounter: Payer: Self-pay | Admitting: Family Medicine

## 2018-02-02 NOTE — Progress Notes (Signed)
Negative for intraepithelial lesion or malignancy.  

## 2018-10-01 ENCOUNTER — Encounter: Payer: Self-pay | Admitting: Family Medicine

## 2018-10-01 ENCOUNTER — Ambulatory Visit (INDEPENDENT_AMBULATORY_CARE_PROVIDER_SITE_OTHER): Payer: BLUE CROSS/BLUE SHIELD | Admitting: Family Medicine

## 2018-10-01 VITALS — BP 111/57 | HR 72 | Ht 65.5 in | Wt 180.0 lb

## 2018-10-01 DIAGNOSIS — M5412 Radiculopathy, cervical region: Secondary | ICD-10-CM | POA: Diagnosis not present

## 2018-10-01 DIAGNOSIS — S29012A Strain of muscle and tendon of back wall of thorax, initial encounter: Secondary | ICD-10-CM | POA: Diagnosis not present

## 2018-10-01 MED ORDER — GABAPENTIN 300 MG PO CAPS: ORAL_CAPSULE | ORAL | 3 refills | Status: AC

## 2018-10-01 MED ORDER — CYCLOBENZAPRINE HCL 10 MG PO TABS: 10.0000 mg | ORAL_TABLET | Freq: Three times a day (TID) | ORAL | 0 refills | Status: AC | PRN

## 2018-10-01 MED ORDER — PREDNISONE 50 MG PO TABS: 50.0000 mg | ORAL_TABLET | Freq: Every day | ORAL | 0 refills | Status: AC

## 2018-10-01 NOTE — Patient Instructions (Signed)
Thank you for coming in today. Attend physical therapy.  Use the TENS unit as needed.  Use heating pad.  Take the prednisone for 5 days.  Use gabapentin as needed.  Use cyclobenzaprine for muscle spasm as needed mostly at bedtime.   Come back or go to the emergency room if you notice new weakness new numbness problems walking or bowel or bladder problems.  TENS UNIT: This is helpful for muscle pain and spasm.   Search and Purchase a TENS 7000 2nd edition at  www.tenspros.com or www.Amazon.com It should be less than $30.     TENS unit instructions: Do not shower or bathe with the unit on Turn the unit off before removing electrodes or batteries If the electrodes lose stickiness add a drop of water to the electrodes after they are disconnected from the unit and place on plastic sheet. If you continued to have difficulty, call the TENS unit company to purchase more electrodes. Do not apply lotion on the skin area prior to use. Make sure the skin is clean and dry as this will help prolong the life of the electrodes. After use, always check skin for unusual red areas, rash or other skin difficulties. If there are any skin problems, does not apply electrodes to the same area. Never remove the electrodes from the unit by pulling the wires. Do not use the TENS unit or electrodes other than as directed. Do not change electrode placement without consultating your therapist or physician. Keep 2 fingers with between each electrode. Wear time ratio is 2:1, on to off times.    For example on for 30 minutes off for 15 minutes and then on for 30 minutes off for 15 minutes    Cervical Radiculopathy  Cervical radiculopathy happens when a nerve in the neck (cervical nerve) is pinched or bruised. This condition can develop because of an injury or as part of the normal aging process. Pressure on the cervical nerves can cause pain or numbness that runs from the neck all the way down into the arm and  fingers. Usually, this condition gets better with rest. Treatment may be needed if the condition does not improve. What are the causes? This condition may be caused by:  Injury.  Slipped (herniated) disk.  Muscle tightness in the neck because of overuse.  Arthritis.  Breakdown or degeneration in the bones and joints of the spine (spondylosis) due to aging.  Bone spurs that may develop near the cervical nerves. What are the signs or symptoms? Symptoms of this condition include:  Pain that runs from the neck to the arm and hand. The pain can be severe or irritating. It may be worse when the neck is moved.  Numbness or weakness in the affected arm and hand. How is this diagnosed? This condition may be diagnosed based on symptoms, medical history, and a physical exam. You may also have tests, including:  X-rays.  CT scan.  MRI.  Electromyogram (EMG).  Nerve conduction tests. How is this treated? In many cases, treatment is not needed for this condition. With rest, the condition usually gets better over time. If treatment is needed, options may include:  Wearing a soft neck collar for short periods of time.  Physical therapy to strengthen your neck muscles.  Medicines, such as NSAIDs, oral corticosteroids, or spinal injections.  Surgery. This may be needed if other treatments do not help. Various types of surgery may be done depending on the cause of your problems. Follow these  instructions at home: Managing pain  Take over-the-counter and prescription medicines only as told by your health care provider.  If directed, apply ice to the affected area. ? Put ice in a plastic bag. ? Place a towel between your skin and the bag. ? Leave the ice on for 20 minutes, 2-3 times per day.  If ice does not help, you can try using heat. Take a warm shower or warm bath, or use a heat pack as told by your health care provider.  Try a gentle neck and shoulder massage to help relieve  symptoms. Activity  Rest as needed. Follow instructions from your health care provider about any restrictions on activities.  Do stretching and strengthening exercises as told by your health care provider or physical therapist. General instructions  If you were given a soft collar, wear it as told by your health care provider.  Use a flat pillow when you sleep.  Keep all follow-up visits as told by your health care provider. This is important. Contact a health care provider if:  Your condition does not improve with treatment. Get help right away if:  Your pain gets much worse and cannot be controlled with medicines.  You have weakness or numbness in your hand, arm, face, or leg.  You have a high fever.  You have a stiff, rigid neck.  You lose control of your bowels or your bladder (have incontinence).  You have trouble with walking, balance, or speaking. This information is not intended to replace advice given to you by your health care provider. Make sure you discuss any questions you have with your health care provider. Document Released: 05/28/2001 Document Revised: 02/08/2016 Document Reviewed: 10/27/2014 Elsevier Interactive Patient Education  [employer].

## 2018-10-01 NOTE — Progress Notes (Signed)
Glennon HamiltonKasey R Foster is a 43 y.o. female who [facility] today for thoracic back pain.  Terri Foster notes a 5-day history of pain in the right thoracic spine near the right scapula.  She denies any injury and notes the pain started suddenly.  Pain is worse with shoulder motion and thoracic back motion.  She denies any fevers chills nausea vomiting or diarrhea.  She does note some subtle weakness into her right hand.  She is right-hand dominant and notes that she is having some difficulty or changes with her handwriting.  She denies significant numbness or pain radiating down her hand.  She notes that with neck motion her thoracic back pain gets a little worse but it does not affect her right hand symptoms at all.  She is tried high-dose ibuprofen and some leftover muscle relaxers and ice for pain which is helped only a little.    ROS:  As above  Exam:  BP (!) 111/57   Pulse 72   Ht 5' 5.5" (1.664 m)   Wt 180 lb (81.6 kg)   BMI 29.50 kg/m  Wt Readings from Last 5 Encounters:  10/01/18 180 lb (81.6 kg)  12/26/17 173 lb 14.4 oz (78.9 kg)  12/03/17 174 lb (78.9 kg)  11/12/16 171 lb (77.6 kg)  11/05/16 171 lb (77.6 kg)   General: Well Developed, well nourished, and in no acute distress.  Neuro/Psych: Alert and oriented x3, extra-ocular muscles intact, able to move all 4 extremities, sensation grossly intact. Skin: Warm and dry, no rashes noted.  Respiratory: Not using accessory muscles, speaking in full sentences, trachea midline.  Cardiovascular: Pulses palpable, no extremity edema. Abdomen: Does not appear distended. MSK:  C-spine: Nontender to spinal midline.  Tender to palpation right trapezius. Cervical motion limited especially with right rotation and lateral flexion which does produce some pain into the right rhomboid area but otherwise normal. Negative Spurling's test. Upper extremity reflexes are equal and normal  throughout. Sensation is intact throughout upper extremities. Upper semi-strength is equal and normal throughout except for right grip strength with is very subtly diminished 4+/5. Left grip strength is normal.  T-spine: Normal-appearing.  Nontender spinal midline.  Tender palpation right rhomboid and right.  Medial inferior periscapular border. Pain reproduced with scapular motion.     Lab and Radiology Results No results found for this or any previous visit (from the past 72 hour(s)). No results found.     Assessment and Plan: 43 y.o. female with right thoracic back pain very likely rhomboid muscle strain and spasm.  Patient also has some subtle hand symptoms which could be related to C8 radiculopathy.  The C8 dermatome does involve that region of the thoracic back however I do not think her symptoms are very likely related to radiculopathy.  Plan for home exercise program physical therapy heating pad and TENS unit.  Will use short course of prednisone.  Additionally will use cyclobenzaprine.  Will use a trial of gabapentin as if her pain is nerve related then gabapentin may help as well.  The majority of her symptoms to get better with physical therapy.  Recheck if not improving.    Orders Placed This Encounter  Procedures  . Ambulatory referral to Physical Therapy    Referral Priority:   Routine    Referral Type:   Physical Medicine    Referral Reason:   Specialty Services Required    Requested Specialty:   Physical Therapy  Number of Visits Requested:   1   Meds ordered this encounter  Medications  . predniSONE (DELTASONE) 50 MG tablet    Sig: Take 1 tablet (50 mg total) by mouth daily.    Dispense:  5 tablet    Refill:  0  . cyclobenzaprine (FLEXERIL) 10 MG tablet    Sig: Take 1 tablet (10 mg total) by mouth 3 (three) times daily as needed for muscle spasms.    Dispense:  30 tablet    Refill:  0  . gabapentin (NEURONTIN) 300 MG capsule    Sig: One tab PO qHS for  a week, then BID for a week, then TID. May double weekly to a max of 3,600mg /day    Dispense:  180 capsule    Refill:  3    Historical information moved to improve visibility of documentation.  Past Medical History:  Diagnosis Date  . Antiphospholipid antibody syndrome (HCC) 04/26/2016   Past Surgical History:  Procedure Laterality Date  . APPENDECTOMY    . HEMORRHOID SURGERY  1996  . KNEE ARTHROSCOPY Right   . LAPAROSCOPIC ABDOMINAL EXPLORATION     Fertility  . TONSILLECTOMY AND ADENOIDECTOMY  age 32  . WRIST SURGERY Right 2008   Social History   Tobacco Use  . Smoking status: Never Smoker  . Smokeless tobacco: Never Used  Substance Use Topics  . Alcohol use: No    Alcohol/week: 0.0 standard drinks   family history includes Breast cancer in her maternal grandmother; Colon cancer in her paternal grandmother; Colon polyps in her father; Diabetes in her maternal grandmother; Heart disease in her maternal grandfather and maternal grandmother; Hypertension in her father; Irritable bowel syndrome in her mother; Kidney disease in an other family member; Uterine cancer in her paternal grandmother.  Medications: Current Outpatient Medications  Medication Sig Dispense Refill  . cyclobenzaprine (FLEXERIL) 10 MG tablet Take 1 tablet (10 mg total) by mouth 3 (three) times daily as needed for muscle spasms. 30 tablet 0  . gabapentin (NEURONTIN) 300 MG capsule One tab PO qHS for a week, then BID for a week, then TID. May double weekly to a max of 3,600mg /day 180 capsule 3  . predniSONE (DELTASONE) 50 MG tablet Take 1 tablet (50 mg total) by mouth daily. 5 tablet 0   Current Facility-Administered Medications  Medication Dose Route Frequency Provider Last Rate Last Dose  . 0.9 %  sodium chloride infusion  500 mL Intravenous Continuous Nandigam, Eleonore Chiquito, MD       Allergies  Allergen Reactions  . Erythromycin Hives  . Azithromycin Rash      Discussed warning signs or symptoms. Please  see discharge instructions. Patient expresses understanding.

## 2019-02-20 IMAGING — US US PELVIS COMPLETE
1 series · 14 of 25 positions shown · non-contrast
Comparison: None

CLINICAL DATA: Left pelvic pain for 11 days

EXAM:
TRANSABDOMINAL AND TRANSVAGINAL ULTRASOUND OF PELVIS
TECHNIQUE: Both transabdominal and transvaginal ultrasound examinations of the
pelvis were performed. Transabdominal technique was performed for
global imaging of the pelvis including uterus, ovaries, adnexal
regions, and pelvic cul-de-sac. It was necessary to proceed with
endovaginal exam following the transabdominal exam to visualize the
endometrium and ovaries.

[Series 1: us pelvis complete · 0.29mm/px · 14 of 49 slices shown]
[im 1/49]
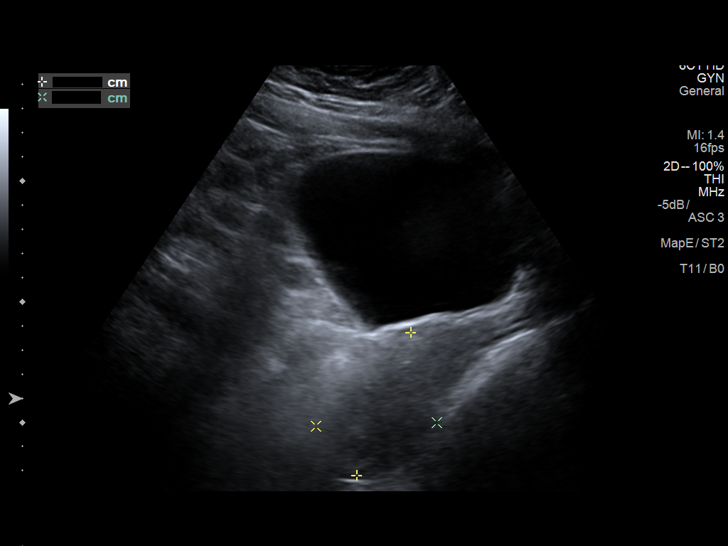
[im 5/49]
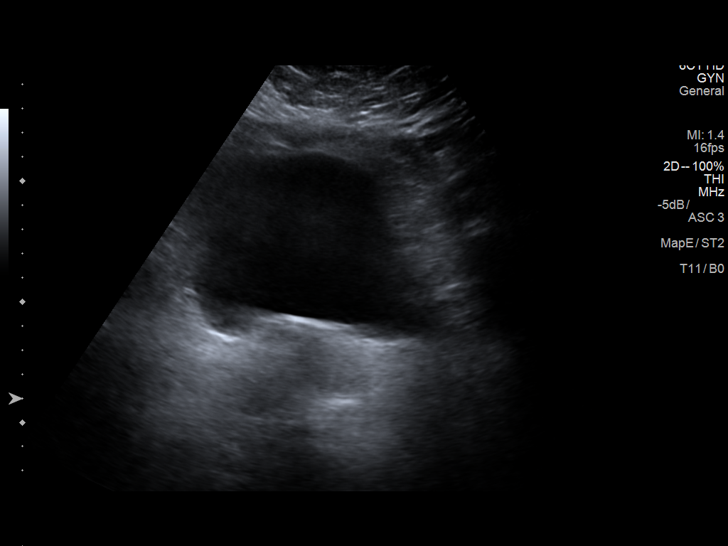
[im 9/49]
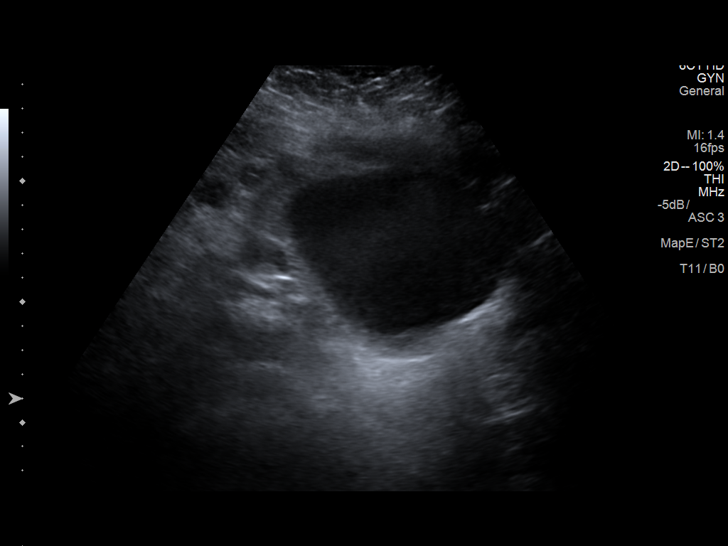
[im 13/49]
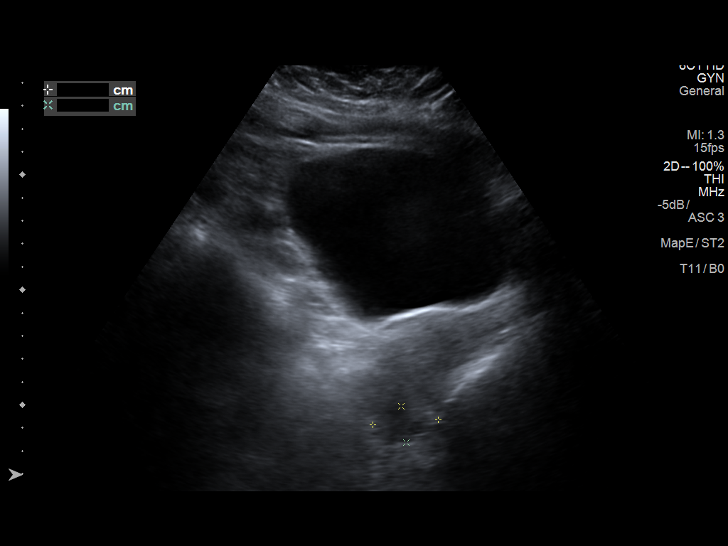
[im 17/49]
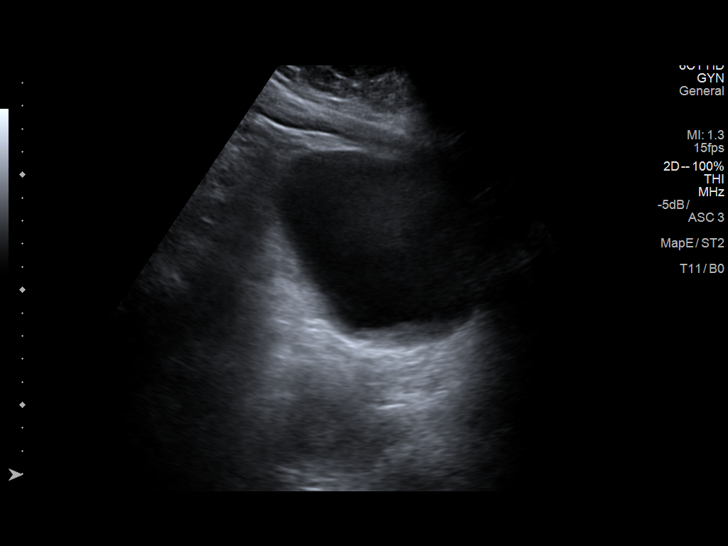
[im 19/49]
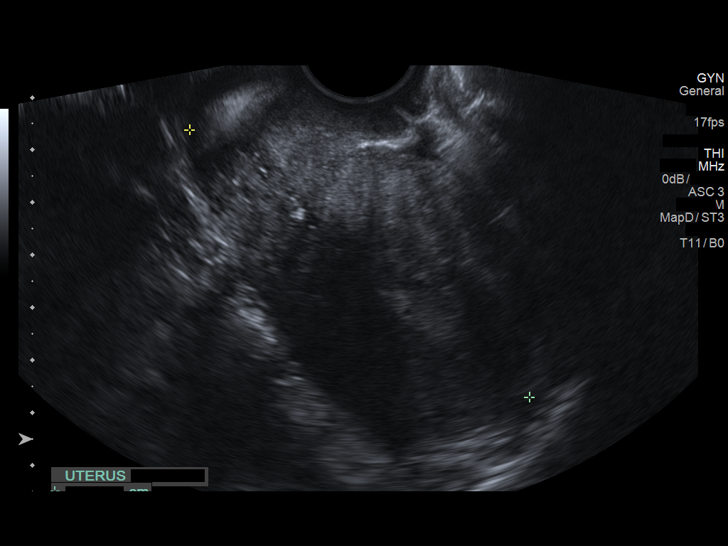
[im 23/49]
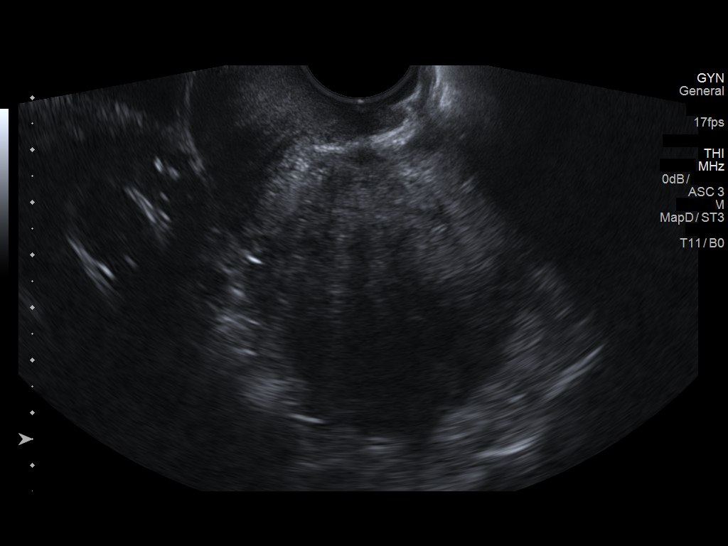
[im 27/49]
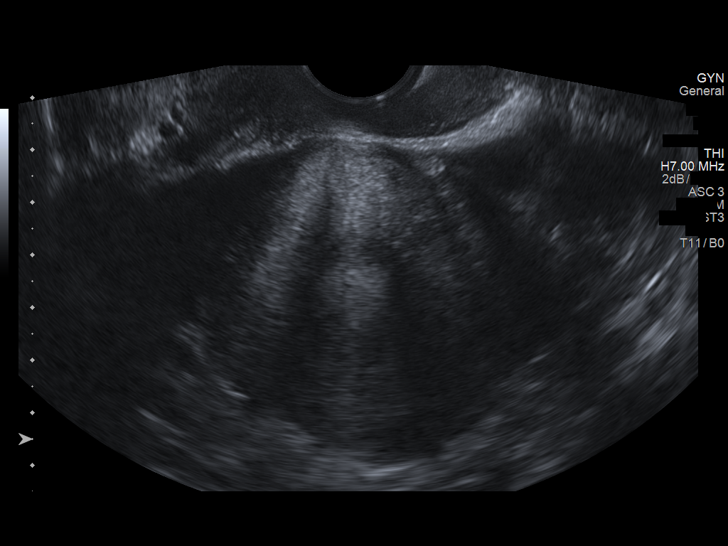
[im 31/49]
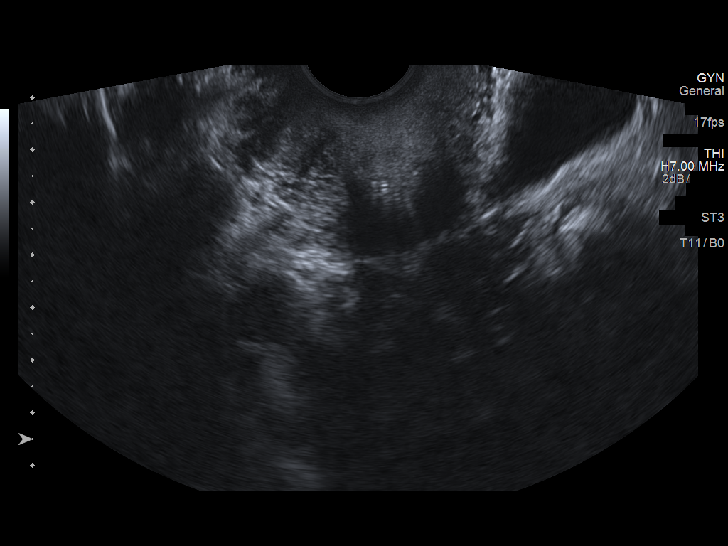
[im 33/49]
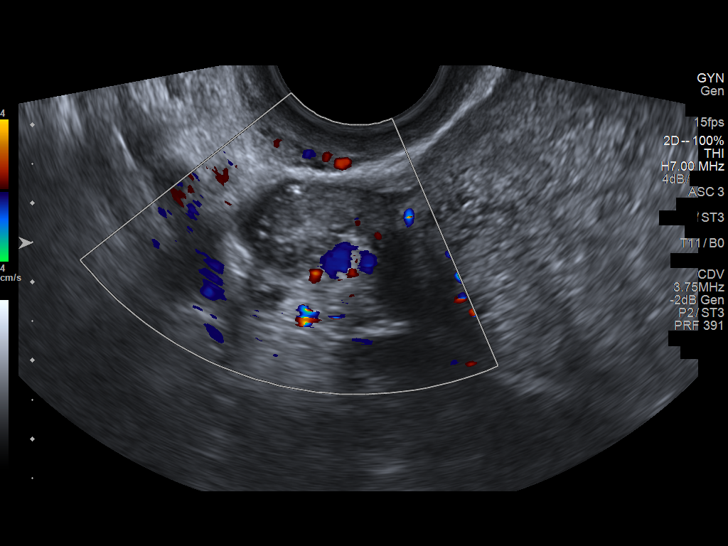
[im 37/49]
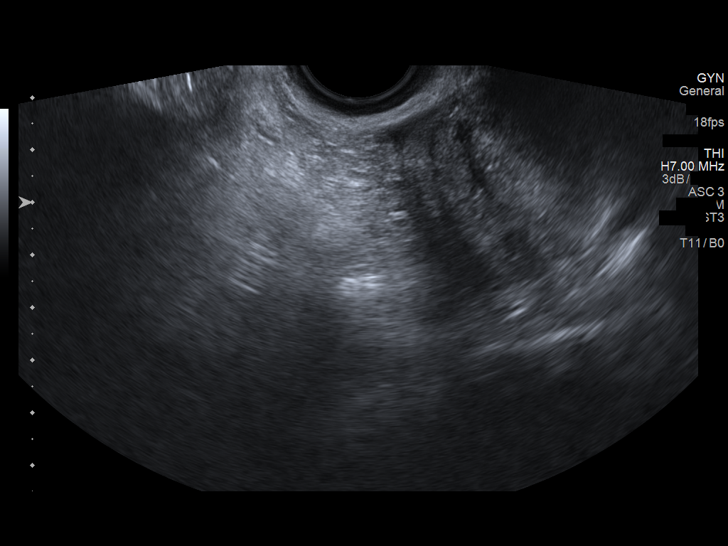
[im 41/49]
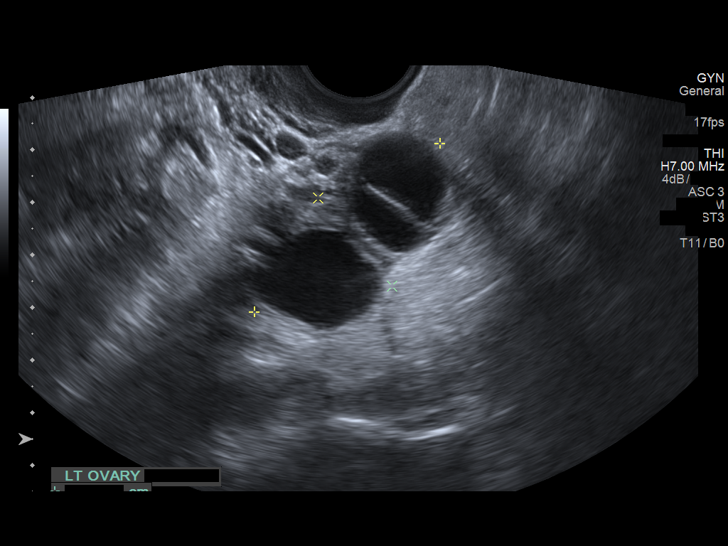
[im 45/49]
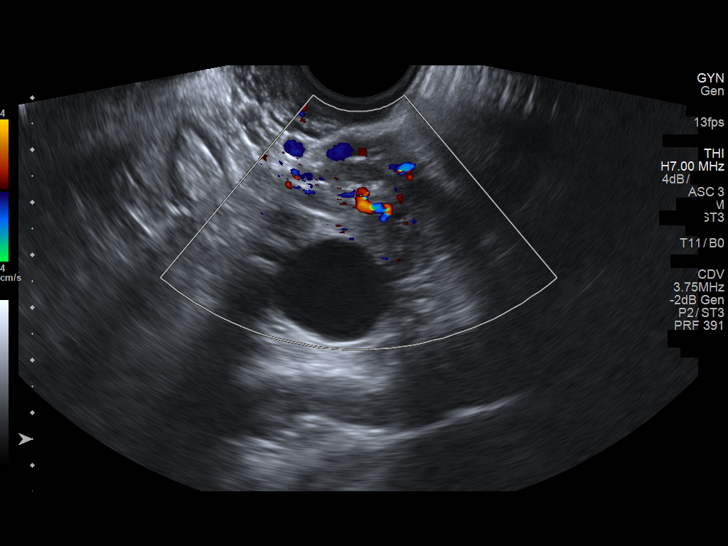
[im 49/49]
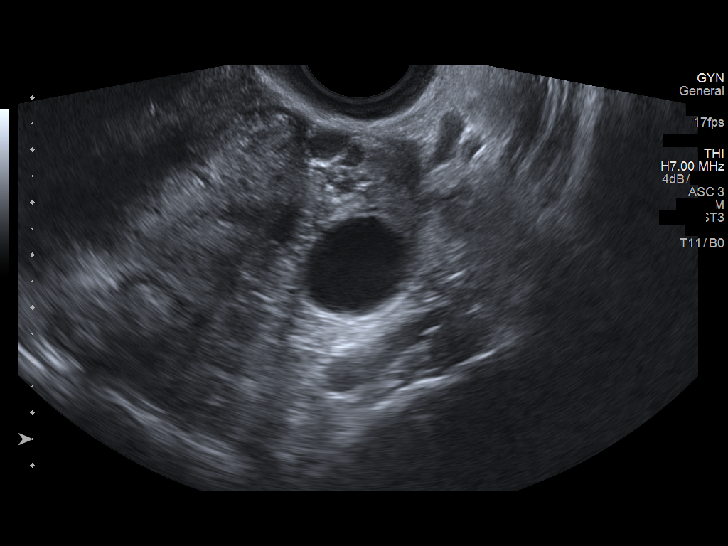

[14 of 25 positions shown; findings below may reference images not displayed]

FINDINGS: Uterus

Measurements: 8.2 x 5.3 x 1.1 cm. No fibroids or other mass
visualized.

Endometrium

Thickness: 11.  No focal abnormality visualized.

Right ovary

Measurements: 2.2 x 1.4 x 1.8 cm. Normal appearance/no adnexal mass.
Small cyst to the right of the uterus measuring 1.1 cm, possible
para ovarian cyst.

Left ovary

Measurements: 4.8 x 2.2 x 2 cm. Multiple prominent follicles in the
left ovary. No dominant cyst.

Other findings

No abnormal free fluid.
IMPRESSION: 1. Multiple prominent follicles in the left ovary but without
dominant cyst.
2. Possible 1 cm para ovarian cyst on the right
3. Otherwise negative pelvic ultrasound
